# Patient Record
Sex: Female | Born: 1940 | Race: White | Hispanic: Refuse to answer | Marital: Married | Smoking: Never smoker
Health system: Southern US, Community
[De-identification: ages and names within clinical notes are randomized; demographics above are authoritative.]

## PROBLEM LIST (undated history)

## (undated) DIAGNOSIS — I499 Cardiac arrhythmia, unspecified: Secondary | ICD-10-CM

## (undated) DIAGNOSIS — H814 Vertigo of central origin: Secondary | ICD-10-CM

## (undated) DIAGNOSIS — K5792 Diverticulitis of intestine, part unspecified, without perforation or abscess without bleeding: Secondary | ICD-10-CM

## (undated) DIAGNOSIS — E782 Mixed hyperlipidemia: Secondary | ICD-10-CM

## (undated) DIAGNOSIS — M810 Age-related osteoporosis without current pathological fracture: Secondary | ICD-10-CM

## (undated) DIAGNOSIS — K449 Diaphragmatic hernia without obstruction or gangrene: Secondary | ICD-10-CM

## (undated) DIAGNOSIS — I4891 Unspecified atrial fibrillation: Secondary | ICD-10-CM

## (undated) DIAGNOSIS — G250 Essential tremor: Secondary | ICD-10-CM

## (undated) HISTORY — DX: Age-related osteoporosis without current pathological fracture: M81.0

## (undated) HISTORY — DX: Diaphragmatic hernia without obstruction or gangrene: K44.9

## (undated) HISTORY — DX: Vertigo of central origin: H81.4

## (undated) HISTORY — DX: Diverticulitis of intestine, part unspecified, without perforation or abscess without bleeding: K57.92

## (undated) HISTORY — DX: Mixed hyperlipidemia: E78.2

## (undated) HISTORY — DX: Essential tremor: G25.0

## (undated) HISTORY — DX: Unspecified atrial fibrillation: I48.91

---

## 2010-07-24 HISTORY — PX: CATARACT EXTRACTION W/ INTRAOCULAR LENS  IMPLANT, BILATERAL: SHX1307

## 2012-07-24 HISTORY — PX: CARPAL TUNNEL RELEASE: SHX101

## 2015-12-07 DIAGNOSIS — H43813 Vitreous degeneration, bilateral: Secondary | ICD-10-CM | POA: Diagnosis not present

## 2016-01-07 DIAGNOSIS — R509 Fever, unspecified: Secondary | ICD-10-CM | POA: Diagnosis not present

## 2016-01-07 DIAGNOSIS — K5732 Diverticulitis of large intestine without perforation or abscess without bleeding: Secondary | ICD-10-CM | POA: Diagnosis not present

## 2016-01-07 DIAGNOSIS — K529 Noninfective gastroenteritis and colitis, unspecified: Secondary | ICD-10-CM | POA: Diagnosis not present

## 2016-01-07 DIAGNOSIS — R109 Unspecified abdominal pain: Secondary | ICD-10-CM | POA: Diagnosis not present

## 2016-01-07 DIAGNOSIS — R1084 Generalized abdominal pain: Secondary | ICD-10-CM | POA: Diagnosis not present

## 2016-02-01 DIAGNOSIS — R1084 Generalized abdominal pain: Secondary | ICD-10-CM | POA: Diagnosis not present

## 2016-02-01 DIAGNOSIS — K5792 Diverticulitis of intestine, part unspecified, without perforation or abscess without bleeding: Secondary | ICD-10-CM | POA: Diagnosis not present

## 2016-02-01 DIAGNOSIS — K5909 Other constipation: Secondary | ICD-10-CM | POA: Diagnosis not present

## 2016-02-01 DIAGNOSIS — R509 Fever, unspecified: Secondary | ICD-10-CM | POA: Diagnosis not present

## 2016-04-12 DIAGNOSIS — I10 Essential (primary) hypertension: Secondary | ICD-10-CM | POA: Diagnosis not present

## 2016-04-12 DIAGNOSIS — Z23 Encounter for immunization: Secondary | ICD-10-CM | POA: Diagnosis not present

## 2016-04-12 DIAGNOSIS — F419 Anxiety disorder, unspecified: Secondary | ICD-10-CM | POA: Diagnosis not present

## 2016-04-12 DIAGNOSIS — Z1389 Encounter for screening for other disorder: Secondary | ICD-10-CM | POA: Diagnosis not present

## 2016-04-12 DIAGNOSIS — E669 Obesity, unspecified: Secondary | ICD-10-CM | POA: Diagnosis not present

## 2016-04-12 DIAGNOSIS — Z1231 Encounter for screening mammogram for malignant neoplasm of breast: Secondary | ICD-10-CM | POA: Diagnosis not present

## 2016-04-12 DIAGNOSIS — Z0000 Encounter for general adult medical examination without abnormal findings: Secondary | ICD-10-CM | POA: Diagnosis not present

## 2016-04-12 DIAGNOSIS — E7800 Pure hypercholesterolemia, unspecified: Secondary | ICD-10-CM | POA: Diagnosis not present

## 2016-04-12 DIAGNOSIS — Z79899 Other long term (current) drug therapy: Secondary | ICD-10-CM | POA: Diagnosis not present

## 2016-05-05 DIAGNOSIS — Z1231 Encounter for screening mammogram for malignant neoplasm of breast: Secondary | ICD-10-CM | POA: Diagnosis not present

## 2016-05-18 DIAGNOSIS — N644 Mastodynia: Secondary | ICD-10-CM | POA: Diagnosis not present

## 2016-05-18 DIAGNOSIS — N6019 Diffuse cystic mastopathy of unspecified breast: Secondary | ICD-10-CM | POA: Diagnosis not present

## 2016-05-19 DIAGNOSIS — Z9181 History of falling: Secondary | ICD-10-CM | POA: Diagnosis not present

## 2016-05-19 DIAGNOSIS — E669 Obesity, unspecified: Secondary | ICD-10-CM | POA: Diagnosis not present

## 2016-05-19 DIAGNOSIS — Z0000 Encounter for general adult medical examination without abnormal findings: Secondary | ICD-10-CM | POA: Diagnosis not present

## 2016-05-19 DIAGNOSIS — Z136 Encounter for screening for cardiovascular disorders: Secondary | ICD-10-CM | POA: Diagnosis not present

## 2016-05-19 DIAGNOSIS — I10 Essential (primary) hypertension: Secondary | ICD-10-CM | POA: Diagnosis not present

## 2016-05-19 DIAGNOSIS — M810 Age-related osteoporosis without current pathological fracture: Secondary | ICD-10-CM | POA: Diagnosis not present

## 2016-05-24 DIAGNOSIS — Z1211 Encounter for screening for malignant neoplasm of colon: Secondary | ICD-10-CM | POA: Diagnosis not present

## 2016-05-24 DIAGNOSIS — N183 Chronic kidney disease, stage 3 (moderate): Secondary | ICD-10-CM | POA: Diagnosis not present

## 2016-05-24 DIAGNOSIS — F3340 Major depressive disorder, recurrent, in remission, unspecified: Secondary | ICD-10-CM | POA: Diagnosis not present

## 2016-05-24 DIAGNOSIS — Z87898 Personal history of other specified conditions: Secondary | ICD-10-CM | POA: Diagnosis not present

## 2016-05-24 DIAGNOSIS — E785 Hyperlipidemia, unspecified: Secondary | ICD-10-CM | POA: Diagnosis not present

## 2016-05-24 DIAGNOSIS — K219 Gastro-esophageal reflux disease without esophagitis: Secondary | ICD-10-CM | POA: Diagnosis not present

## 2016-05-24 DIAGNOSIS — I129 Hypertensive chronic kidney disease with stage 1 through stage 4 chronic kidney disease, or unspecified chronic kidney disease: Secondary | ICD-10-CM | POA: Diagnosis not present

## 2016-05-24 DIAGNOSIS — M810 Age-related osteoporosis without current pathological fracture: Secondary | ICD-10-CM | POA: Diagnosis not present

## 2016-07-25 DIAGNOSIS — K219 Gastro-esophageal reflux disease without esophagitis: Secondary | ICD-10-CM | POA: Diagnosis not present

## 2016-07-25 DIAGNOSIS — R195 Other fecal abnormalities: Secondary | ICD-10-CM | POA: Diagnosis not present

## 2016-08-31 DIAGNOSIS — Z9049 Acquired absence of other specified parts of digestive tract: Secondary | ICD-10-CM | POA: Diagnosis not present

## 2016-08-31 DIAGNOSIS — N189 Chronic kidney disease, unspecified: Secondary | ICD-10-CM | POA: Diagnosis not present

## 2016-08-31 DIAGNOSIS — E785 Hyperlipidemia, unspecified: Secondary | ICD-10-CM | POA: Diagnosis not present

## 2016-08-31 DIAGNOSIS — K219 Gastro-esophageal reflux disease without esophagitis: Secondary | ICD-10-CM | POA: Diagnosis not present

## 2016-08-31 DIAGNOSIS — K449 Diaphragmatic hernia without obstruction or gangrene: Secondary | ICD-10-CM | POA: Diagnosis not present

## 2016-08-31 DIAGNOSIS — K2970 Gastritis, unspecified, without bleeding: Secondary | ICD-10-CM | POA: Diagnosis not present

## 2016-08-31 DIAGNOSIS — K3189 Other diseases of stomach and duodenum: Secondary | ICD-10-CM | POA: Diagnosis not present

## 2016-08-31 DIAGNOSIS — B9681 Helicobacter pylori [H. pylori] as the cause of diseases classified elsewhere: Secondary | ICD-10-CM | POA: Diagnosis not present

## 2016-08-31 DIAGNOSIS — K5790 Diverticulosis of intestine, part unspecified, without perforation or abscess without bleeding: Secondary | ICD-10-CM | POA: Diagnosis not present

## 2016-08-31 DIAGNOSIS — K2900 Acute gastritis without bleeding: Secondary | ICD-10-CM | POA: Diagnosis not present

## 2016-08-31 DIAGNOSIS — R195 Other fecal abnormalities: Secondary | ICD-10-CM | POA: Diagnosis not present

## 2016-09-08 DIAGNOSIS — I480 Paroxysmal atrial fibrillation: Secondary | ICD-10-CM | POA: Diagnosis not present

## 2016-09-08 DIAGNOSIS — R0602 Shortness of breath: Secondary | ICD-10-CM | POA: Diagnosis not present

## 2016-09-08 DIAGNOSIS — I499 Cardiac arrhythmia, unspecified: Secondary | ICD-10-CM | POA: Diagnosis not present

## 2016-09-08 DIAGNOSIS — R42 Dizziness and giddiness: Secondary | ICD-10-CM | POA: Diagnosis not present

## 2016-10-16 DIAGNOSIS — N182 Chronic kidney disease, stage 2 (mild): Secondary | ICD-10-CM | POA: Diagnosis not present

## 2016-10-16 DIAGNOSIS — R7303 Prediabetes: Secondary | ICD-10-CM | POA: Diagnosis not present

## 2016-10-16 DIAGNOSIS — Z87898 Personal history of other specified conditions: Secondary | ICD-10-CM | POA: Diagnosis not present

## 2016-10-16 DIAGNOSIS — F3340 Major depressive disorder, recurrent, in remission, unspecified: Secondary | ICD-10-CM | POA: Diagnosis not present

## 2016-10-16 DIAGNOSIS — Z79899 Other long term (current) drug therapy: Secondary | ICD-10-CM | POA: Diagnosis not present

## 2016-10-16 DIAGNOSIS — K219 Gastro-esophageal reflux disease without esophagitis: Secondary | ICD-10-CM | POA: Diagnosis not present

## 2016-10-16 DIAGNOSIS — I129 Hypertensive chronic kidney disease with stage 1 through stage 4 chronic kidney disease, or unspecified chronic kidney disease: Secondary | ICD-10-CM | POA: Diagnosis not present

## 2016-10-16 DIAGNOSIS — M810 Age-related osteoporosis without current pathological fracture: Secondary | ICD-10-CM | POA: Diagnosis not present

## 2016-10-16 DIAGNOSIS — E785 Hyperlipidemia, unspecified: Secondary | ICD-10-CM | POA: Diagnosis not present

## 2017-01-03 DIAGNOSIS — H6121 Impacted cerumen, right ear: Secondary | ICD-10-CM | POA: Diagnosis not present

## 2017-01-03 DIAGNOSIS — L03211 Cellulitis of face: Secondary | ICD-10-CM | POA: Diagnosis not present

## 2017-04-02 DIAGNOSIS — R109 Unspecified abdominal pain: Secondary | ICD-10-CM | POA: Diagnosis not present

## 2017-04-02 DIAGNOSIS — R300 Dysuria: Secondary | ICD-10-CM | POA: Diagnosis not present

## 2017-04-02 DIAGNOSIS — K6289 Other specified diseases of anus and rectum: Secondary | ICD-10-CM | POA: Diagnosis not present

## 2017-04-13 DIAGNOSIS — K5732 Diverticulitis of large intestine without perforation or abscess without bleeding: Secondary | ICD-10-CM | POA: Diagnosis not present

## 2017-04-16 DIAGNOSIS — E7800 Pure hypercholesterolemia, unspecified: Secondary | ICD-10-CM | POA: Diagnosis not present

## 2017-04-16 DIAGNOSIS — Z79899 Other long term (current) drug therapy: Secondary | ICD-10-CM | POA: Diagnosis not present

## 2017-04-16 DIAGNOSIS — I129 Hypertensive chronic kidney disease with stage 1 through stage 4 chronic kidney disease, or unspecified chronic kidney disease: Secondary | ICD-10-CM | POA: Diagnosis not present

## 2017-04-16 DIAGNOSIS — Z0000 Encounter for general adult medical examination without abnormal findings: Secondary | ICD-10-CM | POA: Diagnosis not present

## 2017-04-16 DIAGNOSIS — Z1389 Encounter for screening for other disorder: Secondary | ICD-10-CM | POA: Diagnosis not present

## 2017-04-16 DIAGNOSIS — E559 Vitamin D deficiency, unspecified: Secondary | ICD-10-CM | POA: Diagnosis not present

## 2017-05-07 DIAGNOSIS — Z1231 Encounter for screening mammogram for malignant neoplasm of breast: Secondary | ICD-10-CM | POA: Diagnosis not present

## 2017-05-22 DIAGNOSIS — Z136 Encounter for screening for cardiovascular disorders: Secondary | ICD-10-CM | POA: Diagnosis not present

## 2017-05-22 DIAGNOSIS — Z0000 Encounter for general adult medical examination without abnormal findings: Secondary | ICD-10-CM | POA: Diagnosis not present

## 2017-05-22 DIAGNOSIS — E785 Hyperlipidemia, unspecified: Secondary | ICD-10-CM | POA: Diagnosis not present

## 2017-05-22 DIAGNOSIS — Z23 Encounter for immunization: Secondary | ICD-10-CM | POA: Diagnosis not present

## 2017-05-22 DIAGNOSIS — Z1389 Encounter for screening for other disorder: Secondary | ICD-10-CM | POA: Diagnosis not present

## 2017-10-17 DIAGNOSIS — E782 Mixed hyperlipidemia: Secondary | ICD-10-CM | POA: Insufficient documentation

## 2017-10-17 DIAGNOSIS — M810 Age-related osteoporosis without current pathological fracture: Secondary | ICD-10-CM | POA: Insufficient documentation

## 2017-10-17 DIAGNOSIS — F3340 Major depressive disorder, recurrent, in remission, unspecified: Secondary | ICD-10-CM | POA: Insufficient documentation

## 2017-10-17 DIAGNOSIS — K5900 Constipation, unspecified: Secondary | ICD-10-CM | POA: Insufficient documentation

## 2017-10-17 DIAGNOSIS — G4700 Insomnia, unspecified: Secondary | ICD-10-CM | POA: Insufficient documentation

## 2017-10-26 DIAGNOSIS — G250 Essential tremor: Secondary | ICD-10-CM | POA: Insufficient documentation

## 2018-04-04 DIAGNOSIS — G4452 New daily persistent headache (NDPH): Secondary | ICD-10-CM | POA: Insufficient documentation

## 2018-04-04 DIAGNOSIS — H814 Vertigo of central origin: Secondary | ICD-10-CM | POA: Insufficient documentation

## 2018-07-31 DIAGNOSIS — L989 Disorder of the skin and subcutaneous tissue, unspecified: Secondary | ICD-10-CM | POA: Insufficient documentation

## 2019-03-11 DIAGNOSIS — Z79899 Other long term (current) drug therapy: Secondary | ICD-10-CM | POA: Insufficient documentation

## 2019-09-16 DIAGNOSIS — R7309 Other abnormal glucose: Secondary | ICD-10-CM | POA: Insufficient documentation

## 2019-11-26 DIAGNOSIS — R6884 Jaw pain: Secondary | ICD-10-CM | POA: Insufficient documentation

## 2020-01-11 DIAGNOSIS — I4891 Unspecified atrial fibrillation: Secondary | ICD-10-CM

## 2020-01-11 DIAGNOSIS — K219 Gastro-esophageal reflux disease without esophagitis: Secondary | ICD-10-CM | POA: Diagnosis not present

## 2020-01-15 DIAGNOSIS — I4819 Other persistent atrial fibrillation: Secondary | ICD-10-CM | POA: Insufficient documentation

## 2020-02-02 ENCOUNTER — Other Ambulatory Visit: Payer: Self-pay

## 2020-02-02 ENCOUNTER — Encounter: Payer: Self-pay | Admitting: Cardiology

## 2020-02-02 ENCOUNTER — Ambulatory Visit: Payer: Medicare HMO | Admitting: Cardiology

## 2020-02-02 VITALS — BP 124/78 | HR 88 | Ht 62.0 in | Wt 135.0 lb

## 2020-02-02 DIAGNOSIS — I480 Paroxysmal atrial fibrillation: Secondary | ICD-10-CM | POA: Diagnosis not present

## 2020-02-02 DIAGNOSIS — I10 Essential (primary) hypertension: Secondary | ICD-10-CM | POA: Diagnosis not present

## 2020-02-02 DIAGNOSIS — K449 Diaphragmatic hernia without obstruction or gangrene: Secondary | ICD-10-CM | POA: Insufficient documentation

## 2020-02-02 DIAGNOSIS — E782 Mixed hyperlipidemia: Secondary | ICD-10-CM | POA: Diagnosis not present

## 2020-02-02 NOTE — Patient Instructions (Addendum)
Medication Instructions:  Your physician has recommended you make the following change in your medication:   Take Xarelto 20 mg daily.  *If you need a refill on your cardiac medications before your next appointment, please call your pharmacy*   Lab Work: None ordered If you have labs (blood work) drawn today and your tests are completely normal, you will receive your results only by: Marland Kitchen MyChart Message (if you have MyChart) OR . A paper copy in the mail If you have any lab test that is abnormal or we need to change your treatment, we will call you to review the results.   Testing/Procedures: Your physician has requested that you have an echocardiogram. Echocardiography is a painless test that uses sound waves to create images of your heart. It provides your doctor with information about the size and shape of your heart and how well your heart's chambers and valves are working. This procedure takes approximately one hour. There are no restrictions for this procedure.     Follow-Up: At [facility], you and your health needs are our priority.  As part of our continuing mission to provide you with exceptional heart care, we have created designated Provider Care Teams.  These Care Teams include your primary Cardiologist (physician) and Advanced Practice Providers (APPs -  Physician Assistants and Nurse Practitioners) who all work together to provide you with the care you need, when you need it.  We recommend signing up for the patient portal called "MyChart".  Sign up information is provided on this After Visit Summary.  MyChart is used to connect with patients for Virtual Visits (Telemedicine).  Patients are able to view lab/test results, encounter notes, upcoming appointments, etc.  Non-urgent messages can be sent to your provider as well.   To learn more about what you can do with MyChart, go to [email or web address].    Your next appointment:   1 month(s)  The format for your next  appointment:   In Person  Provider:   Thomasene Ripple, DO   Other Instructions NA

## 2020-02-02 NOTE — Progress Notes (Signed)
Cardiology Office Note:    Date:  02/02/2020   ID:  Christy Gomez, DOB 07/12/1941, MRN [id number]  PCP:  Laurel Dimmer, FNP  Cardiologist:  Thomasene Ripple, DO  Electrophysiologist:  None   Referring MD: Selena Batten *   " I am doing fine"  History of Present Illness:    Christy Gomez is a 79 y.o. female with a hx of recently diagnosed atrial fibrillation, started on Xarelto and is currently on metoprolol, essential tremors, hyperlipidemia presents today to be evaluated for A. Fib.  The patient tells me that she does not feel any shortness of breath, chest pain however was told by her PCP that she was noted to be atrial fibrillation.  Past Medical History:  Diagnosis Date  . Atrial fibrillation (HCC)   . Benign familial tremor   . Cerebellar vertigo, unspecified laterality   . Disabling essential tremor   . Diverticulitis   . Hiatal hernia   . Mixed hyperlipidemia   . Osteoporosis     Past Surgical History:  Procedure Laterality Date  . CARPAL TUNNEL RELEASE Right 2014  . CATARACT EXTRACTION W/ INTRAOCULAR LENS  IMPLANT, BILATERAL  2012    Current Medications: Current Meds  Medication Sig  . acetaminophen (TYLENOL) 325 MG tablet Take 1 tablet by mouth daily.  Marland Kitchen aspirin 81 MG EC tablet Take 1 tablet by mouth daily.  . Cholecalciferol 50 MCG (2000 UT) CAPS Take 1 capsule by mouth daily.  . clonazePAM (KLONOPIN) 1 MG tablet Take by mouth. Take 0.5 - 1 tablet by mouth 2 times daily as needed for anxiety (tremor)  . esomeprazole (NEXIUM) 20 MG capsule Take 1 capsule by mouth daily.  . fluticasone (FLONASE) 50 MCG/ACT nasal spray Place 1 spray into both nostrils as needed.  . meclizine (ANTIVERT) 12.5 MG tablet Take 1 tablet by mouth daily.  . metoprolol tartrate (LOPRESSOR) 25 MG tablet Take 1 tablet by mouth daily.  . ondansetron (ZOFRAN-ODT) 4 MG disintegrating tablet Take 1 tablet by mouth as needed.      Allergies:   Diazepam, Levofloxacin,  Propranolol, Ciprofloxacin, Gabapentin, Metronidazole, Other, Oxycodone-acetaminophen, Primidone, Sertraline, Topiramate, and Colesevelam   Social History   Socioeconomic History  . Marital status: Married    Spouse name: Not on file  . Number of children: Not on file  . Years of education: Not on file  . Highest education level: Not on file  Occupational History  . Not on file  Tobacco Use  . Smoking status: Never Smoker  . Smokeless tobacco: Never Used  Substance and Sexual Activity  . Alcohol use: Never  . Drug use: Never  . Sexual activity: Not on file  Other Topics Concern  . Not on file  Social History Narrative  . Not on file   Social Determinants of Health   Financial Resource Strain:   . Difficulty of Paying Living Expenses:   Food Insecurity:   . Worried About Programme researcher, broadcasting/film/video in the Last Year:   . Barista in the Last Year:   Transportation Needs:   . Freight forwarder (Medical):   Marland Kitchen Lack of Transportation (Non-Medical):   Physical Activity:   . Days of Exercise per Week:   . Minutes of Exercise per Session:   Stress:   . Feeling of Stress :   Social Connections:   . Frequency of Communication with Friends and Family:   . Frequency of Social Gatherings with Friends and Family:   .  Attends Religious Services:   . Active Member of Clubs or Organizations:   . Attends BankerClub or Organization Meetings:   Marland Kitchen. Marital Status:      Family History: The patient's family history includes Colon cancer in her mother; Tremor in her maternal grandmother, mother, and another family member.  ROS:   Review of Systems  Constitution: Negative for decreased appetite, fever and weight gain.  HENT: Negative for congestion, ear discharge, hoarse voice and sore throat.   Eyes: Negative for discharge, redness, vision loss in right eye and visual halos.  Cardiovascular: Negative for chest pain, dyspnea on exertion, leg swelling, orthopnea and palpitations.   Respiratory: Negative for cough, hemoptysis, shortness of breath and snoring.   Endocrine: Negative for heat intolerance and polyphagia.  Hematologic/Lymphatic: Negative for bleeding problem. Does not bruise/bleed easily.  Skin: Negative for flushing, nail changes, rash and suspicious lesions.  Musculoskeletal: Negative for arthritis, joint pain, muscle cramps, myalgias, neck pain and stiffness.  Gastrointestinal: Negative for abdominal pain, bowel incontinence, diarrhea and excessive appetite.  Genitourinary: Negative for decreased libido, genital sores and incomplete emptying.  Neurological: Negative for brief paralysis, focal weakness, headaches and loss of balance.  Psychiatric/Behavioral: Negative for altered mental status, depression and suicidal ideas.  Allergic/Immunologic: Negative for HIV exposure and persistent infections.    EKGs/Labs/Other Studies Reviewed:    The following studies were reviewed today:   EKG:  The ekg ordered today demonstrates atrial fibrillation, with controlled ventricular rate heart rate 92 bpm, compared to EKG done at her PCP office no significant change  Recent Labs: No results found for requested labs within last 8760 hours.  Recent Lipid Panel No results found for: CHOL, TRIG, HDL, CHOLHDL, VLDL, LDLCALC, LDLDIRECT  Physical Exam:    VS:  BP 124/78 (BP Location: Right Arm, Patient Position: Sitting, Cuff Size: Normal)   Pulse 88   Ht 5\' 2"  (1.575 m)   Wt 135 lb (61.2 kg)   SpO2 99%   BMI 24.69 kg/m     Wt Readings from Last 3 Encounters:  02/02/20 135 lb (61.2 kg)     GEN: Well nourished, well developed in no acute distress HEENT: Normal NECK: No JVD; No carotid bruits LYMPHATICS: No lymphadenopathy CARDIAC: S1S2 noted,RRR, no murmurs, rubs, gallops RESPIRATORY:  Clear to auscultation without rales, wheezing or rhonchi  ABDOMEN: Soft, non-tender, non-distended, +bowel sounds, no guarding. EXTREMITIES: No edema, No cyanosis, no  clubbing MUSCULOSKELETAL:  No deformity  SKIN: Warm and dry NEUROLOGIC:  Alert and oriented x 3, non-focal PSYCHIATRIC:  Normal affect, good insight  ASSESSMENT:    1. Paroxysmal atrial fibrillation (HCC)   2. Essential hypertension   3. Mixed hyperlipidemia    PLAN:    Atrial fibrillation-patient has been started on rate control agents.  She was placed on Xarelto 20 mg daily.  I did educate the patient was important for her to continue taking her Xarelto will be given the patient samples in the office today.  Her risk of stroke is increased with a chads vas score of 3 so far.  I will also get a echocardiogram to assess her LV function. I have educated patient on the side effects of this medication. I also urge her to abstain from any taking behaviors.  She understands that she is now at a high risk of bleeding due to being on anticoagulation.  She was also advised that if she ever falls and especially hit her head to be seen in the emergency department.  Looking  into the future if the patient does have essential tremor.  I do believe she will be a candidate for watchman device but for now she does not want to have any procedure for him.  We will rediscuss this and if she is agreeable I will refer her for evaluation for watchman.  Work-up plan took fully anticoagulate the patient.  I will see her back in 1 month and if she is still in fibrillation will consider possible rhythm control strategy as well.  Hypertension blood pressure deceptively in the office no changes will be made.  Hyperlipidemia continue patient her current regimen.  She has significant intolerance to statins as well as WelChol  The patient is in agreement with the above plan. The patient left the office in stable condition.  The patient will follow up in 1 month or sooner if needed.  Medication Adjustments/Labs and Tests Ordered: Current medicines are reviewed at length with the patient today.  Concerns regarding  medicines are outlined above.  No orders of the defined types were placed in this encounter.  No orders of the defined types were placed in this encounter.   Patient Instructions  Medication Instructions:  No medication changes. *If you need a refill on your cardiac medications before your next appointment, please call your pharmacy*   Lab Work: None ordered If you have labs (blood work) drawn today and your tests are completely normal, you will receive your results only by: Marland Kitchen MyChart Message (if you have MyChart) OR . A paper copy in the mail If you have any lab test that is abnormal or we need to change your treatment, we will call you to review the results.   Testing/Procedures: Your physician has requested that you have an echocardiogram. Echocardiography is a painless test that uses sound waves to create images of your heart. It provides your doctor with information about the size and shape of your heart and how well your heart's chambers and valves are working. This procedure takes approximately one hour. There are no restrictions for this procedure.     Follow-Up: At [facility], you and your health needs are our priority.  As part of our continuing mission to provide you with exceptional heart care, we have created designated Provider Care Teams.  These Care Teams include your primary Cardiologist (physician) and Advanced Practice Providers (APPs -  Physician Assistants and Nurse Practitioners) who all work together to provide you with the care you need, when you need it.  We recommend signing up for the patient portal called "MyChart".  Sign up information is provided on this After Visit Summary.  MyChart is used to connect with patients for Virtual Visits (Telemedicine).  Patients are able to view lab/test results, encounter notes, upcoming appointments, etc.  Non-urgent messages can be sent to your provider as well.   To learn more about what you can do with MyChart, go to  [email or web address].    Your next appointment:   1 month(s)  The format for your next appointment:   In Person  Provider:   Thomasene Ripple, DO   Other Instructions NA     Adopting a Healthy Lifestyle.  Know what a healthy weight is for you (roughly BMI <25) and aim to maintain this   Aim for 7+ servings of fruits and vegetables daily   65-80+ fluid ounces of water or unsweet tea for healthy kidneys   Limit to max 1 drink of alcohol per day; avoid smoking/tobacco   Limit animal  fats in diet for cholesterol and heart health - choose grass fed whenever available   Avoid highly processed foods, and foods high in saturated/trans fats   Aim for low stress - take time to unwind and care for your mental health   Aim for 150 min of moderate intensity exercise weekly for heart health, and weights twice weekly for bone health   Aim for 7-9 hours of sleep daily   When it comes to diets, agreement about the perfect plan isnt easy to find, even among the experts. Experts at the [facility] developed an idea known as the Healthy Eating Plate. Just imagine a plate divided into logical, healthy portions.   The emphasis is on diet quality:   Load up on vegetables and fruits - one-half of your plate: Aim for color and variety, and remember that potatoes dont count.   Go for whole grains - one-quarter of your plate: Whole wheat, barley, wheat berries, quinoa, oats, brown rice, and foods made with them. If you want pasta, go with whole wheat pasta.   Protein power - one-quarter of your plate: Fish, chicken, beans, and nuts are all healthy, versatile protein sources. Limit red meat.   The diet, however, does go beyond the plate, offering a few other suggestions.   Use healthy plant oils, such as olive, canola, soy, corn, sunflower and peanut. Check the labels, and avoid partially hydrogenated oil, which have unhealthy trans fats.   If youre thirsty, drink water.  Coffee and tea are good in moderation, but skip sugary drinks and limit milk and dairy products to one or two daily servings.   The type of carbohydrate in the diet is more important than the amount. Some sources of carbohydrates, such as vegetables, fruits, whole grains, and beans-are healthier than others.   Finally, stay active  Signed, Thomasene Ripple, DO  02/02/2020 11:10 AM    [facility]

## 2020-02-06 MED ORDER — RIVAROXABAN 20 MG PO TABS: 20.0000 mg | ORAL_TABLET | Freq: Every day | ORAL | 12 refills | Status: AC

## 2020-02-06 NOTE — Addendum Note (Signed)
Addended by: Eleonore Chiquito on: 02/06/2020 02:59 PM   Modules accepted: Orders

## 2020-02-13 ENCOUNTER — Ambulatory Visit (INDEPENDENT_AMBULATORY_CARE_PROVIDER_SITE_OTHER): Payer: Medicare HMO

## 2020-02-13 ENCOUNTER — Other Ambulatory Visit: Payer: Self-pay

## 2020-02-13 DIAGNOSIS — I480 Paroxysmal atrial fibrillation: Secondary | ICD-10-CM

## 2020-02-13 LAB — ECHOCARDIOGRAM COMPLETE
Area-P 1/2: 3.6 cm2
S' Lateral: 2.7 cm

## 2020-02-13 NOTE — Progress Notes (Signed)
Complete echocardiogram performed.  Jimmy Karey Stucki RDCS, RVT  

## 2020-02-16 ENCOUNTER — Ambulatory Visit: Payer: Self-pay | Admitting: Cardiology

## 2020-02-17 ENCOUNTER — Telehealth: Payer: Self-pay

## 2020-02-17 NOTE — Telephone Encounter (Signed)
Spoke with patient regarding results and recommendation.  Patient verbalizes understanding and is agreeable to plan of care. Advised patient to call back with any issues or concerns.  

## 2020-02-17 NOTE — Telephone Encounter (Signed)
-----   Message from Thomasene Ripple, DO sent at 02/17/2020 10:35 AM EDT -----  You have some tricuspid regurgitation, but otherwise the rest of the echo is normal

## 2020-03-11 ENCOUNTER — Other Ambulatory Visit: Payer: Self-pay

## 2020-03-11 ENCOUNTER — Encounter: Payer: Self-pay | Admitting: Cardiology

## 2020-03-11 ENCOUNTER — Ambulatory Visit: Payer: Medicare HMO | Admitting: Cardiology

## 2020-03-11 VITALS — BP 120/70 | HR 101 | Ht 62.0 in | Wt 125.0 lb

## 2020-03-11 DIAGNOSIS — E782 Mixed hyperlipidemia: Secondary | ICD-10-CM | POA: Diagnosis not present

## 2020-03-11 DIAGNOSIS — I480 Paroxysmal atrial fibrillation: Secondary | ICD-10-CM

## 2020-03-11 NOTE — Patient Instructions (Signed)
Medication Instructions:  No medication changes. *If you need a refill on your cardiac medications before your next appointment, please call your pharmacy*   Lab Work: None ordered If you have labs (blood work) drawn today and your tests are completely normal, you will receive your results only by: . MyChart Message (if you have MyChart) OR . A paper copy in the mail If you have any lab test that is abnormal or we need to change your treatment, we will call you to review the results.   Testing/Procedures: None ordered   Follow-Up: At [facility], you and your health needs are our priority.  As part of our continuing mission to provide you with exceptional heart care, we have created designated Provider Care Teams.  These Care Teams include your primary Cardiologist (physician) and Advanced Practice Providers (APPs -  Physician Assistants and Nurse Practitioners) who all work together to provide you with the care you need, when you need it.  We recommend signing up for the patient portal called "MyChart".  Sign up information is provided on this After Visit Summary.  MyChart is used to connect with patients for Virtual Visits (Telemedicine).  Patients are able to view lab/test results, encounter notes, upcoming appointments, etc.  Non-urgent messages can be sent to your provider as well.   To learn more about what you can do with MyChart, go to [email or web address].    Your next appointment:   3 month(s)  The format for your next appointment:   In Person  Provider:   Kardie Tobb, DO   Other Instructions NA 

## 2020-03-11 NOTE — Progress Notes (Signed)
Cardiology Office Note:    Date:  03/11/2020   ID:  Christy Gomez, DOB 03/05/1941, MRN [id number]  PCP:  Laurel DimmerKruppenbach, Katherine H, FNP  Cardiologist:  Thomasene RippleKardie Kerolos Nehme, DO  Electrophysiologist:  None   Referring MD: Selena BattenKruppenbach, Katherine *   Chief Complaint  Patient presents with  . Follow-up  I am going to be having a colonoscopy/endoscopy  History of Present Illness:    Christy Gomez is a 79 y.o. female with a hx of paroxysmal atrial fibrillation on Xarelto, essential tremors, hyperlipidemia presents today for follow-up visit.  I did see the patient on 02/02/2020 at that time she was on Xarelto and had recently been diagnosed with atrial fibrillation.  I continued patient on Xarelto, additionally I recommended consideration for watchman given her history of essential tremors but the patient did not want to discuss this further at that time.  An echocardiogram was ordered in the meantime which she is results has previously been called out to the people she did have mild to moderate tricuspid regurgitation as well as trivial mitral vegetation.  Her ejection fraction was normal.   Past Medical History:  Diagnosis Date  . Atrial fibrillation (HCC)   . Benign familial tremor   . Cerebellar vertigo, unspecified laterality   . Disabling essential tremor   . Diverticulitis   . Hiatal hernia   . Mixed hyperlipidemia   . Osteoporosis     Past Surgical History:  Procedure Laterality Date  . CARPAL TUNNEL RELEASE Right 2014  . CATARACT EXTRACTION W/ INTRAOCULAR LENS  IMPLANT, BILATERAL  2012    Current Medications: Current Meds  Medication Sig  . acetaminophen (TYLENOL) 325 MG tablet Take 1 tablet by mouth daily.  Marland Kitchen. aspirin 81 MG EC tablet Take 1 tablet by mouth daily.  . Cholecalciferol 50 MCG (2000 UT) CAPS Take 1 capsule by mouth daily.  . clonazePAM (KLONOPIN) 1 MG tablet Take by mouth. Take 0.5 - 1 tablet by mouth 2 times daily as needed for anxiety (tremor)  . esomeprazole  (NEXIUM) 20 MG capsule Take 1 capsule by mouth daily.  . fluticasone (FLONASE) 50 MCG/ACT nasal spray Place 1 spray into both nostrils as needed.  . meclizine (ANTIVERT) 12.5 MG tablet Take 1 tablet by mouth daily.  . metoprolol tartrate (LOPRESSOR) 25 MG tablet Take 1 tablet by mouth daily.  . ondansetron (ZOFRAN-ODT) 4 MG disintegrating tablet Take 1 tablet by mouth as needed.   . predniSONE (DELTASONE) 50 MG tablet Take 1 tablet by mouth daily.   . rivaroxaban (XARELTO) 20 MG TABS tablet Take 1 tablet (20 mg total) by mouth daily with supper.     Allergies:   Diazepam, Levofloxacin, Propranolol, Ciprofloxacin, Gabapentin, Metronidazole, Other, Oxycodone-acetaminophen, Primidone, Sertraline, Topiramate, Acetaminophen, Hydrocodone, and Colesevelam   Social History   Socioeconomic History  . Marital status: Married    Spouse name: Not on file  . Number of children: Not on file  . Years of education: Not on file  . Highest education level: Not on file  Occupational History  . Not on file  Tobacco Use  . Smoking status: Never Smoker  . Smokeless tobacco: Never Used  Substance and Sexual Activity  . Alcohol use: Never  . Drug use: Never  . Sexual activity: Not on file  Other Topics Concern  . Not on file  Social History Narrative  . Not on file   Social Determinants of Health   Financial Resource Strain:   . Difficulty of Paying Living  Expenses: Not on file  Food Insecurity:   . Worried About Programme researcher, broadcasting/film/video in the Last Year: Not on file  . Ran Out of Food in the Last Year: Not on file  Transportation Needs:   . Lack of Transportation (Medical): Not on file  . Lack of Transportation (Non-Medical): Not on file  Physical Activity:   . Days of Exercise per Week: Not on file  . Minutes of Exercise per Session: Not on file  Stress:   . Feeling of Stress : Not on file  Social Connections:   . Frequency of Communication with Friends and Family: Not on file  . Frequency of  Social Gatherings with Friends and Family: Not on file  . Attends Religious Services: Not on file  . Active Member of Clubs or Organizations: Not on file  . Attends Banker Meetings: Not on file  . Marital Status: Not on file     Family History: The patient's family history includes Colon cancer in her mother; Tremor in her maternal grandmother, mother, and another family member.  ROS:   Review of Systems  Constitution: Negative for decreased appetite, fever and weight gain.  HENT: Negative for congestion, ear discharge, hoarse voice and sore throat.   Eyes: Negative for discharge, redness, vision loss in right eye and visual halos.  Cardiovascular: Negative for chest pain, dyspnea on exertion, leg swelling, orthopnea and palpitations.  Respiratory: Negative for cough, hemoptysis, shortness of breath and snoring.   Endocrine: Negative for heat intolerance and polyphagia.  Hematologic/Lymphatic: Negative for bleeding problem. Does not bruise/bleed easily.  Skin: Negative for flushing, nail changes, rash and suspicious lesions.  Musculoskeletal: Negative for arthritis, joint pain, muscle cramps, myalgias, neck pain and stiffness.  Gastrointestinal: Negative for abdominal pain, bowel incontinence, diarrhea and excessive appetite.  Genitourinary: Negative for decreased libido, genital sores and incomplete emptying.  Neurological: Negative for brief paralysis, focal weakness, headaches and loss of balance.  Psychiatric/Behavioral: Negative for altered mental status, depression and suicidal ideas.  Allergic/Immunologic: Negative for HIV exposure and persistent infections.    EKGs/Labs/Other Studies Reviewed:    The following studies were reviewed today:   EKG: None today.   Echo IMPRESSIONS 02/13/2020 1. Left ventricular ejection fraction, by estimation, is 60 to 65%. The left ventricle has normal function. The left ventricle has no regional wall motion abnormalities. Left  ventricular diastolic parameters are  indeterminate.  2. Right ventricular systolic function is normal. The right ventricular size is normal. There is normal pulmonary artery systolic pressure.  3. The mitral valve is normal in structure. Trivial mitral valve regurgitation. No evidence of mitral stenosis.  4. Tricuspid valve regurgitation is mild to moderate.  5. The aortic valve is normal in structure. Aortic valve regurgitation is trivial. No aortic stenosis is present.  6. The inferior vena cava is normal in size with greater than 50% respiratory variability, suggesting right atrial pressure of 3 mmHg  Recent Labs: No results found for requested labs within last 8760 hours.  Recent Lipid Panel No results found for: CHOL, TRIG, HDL, CHOLHDL, VLDL, LDLCALC, LDLDIRECT  Physical Exam:    VS:  BP 120/70 (BP Location: Left Arm, Patient Position: Sitting, Cuff Size: Normal)   Pulse (!) 101   Ht 5\' 2"  (1.575 m)   Wt 125 lb (56.7 kg)   SpO2 91%   BMI 22.86 kg/m     Wt Readings from Last 3 Encounters:  03/11/20 125 lb (56.7 kg)  02/02/20 135 lb (  61.2 kg)     GEN: Well nourished, well developed in no acute distress HEENT: Normal NECK: No JVD; No carotid bruits LYMPHATICS: No lymphadenopathy CARDIAC: S1S2 noted,RRR, no murmurs, rubs, gallops RESPIRATORY:  Clear to auscultation without rales, wheezing or rhonchi  ABDOMEN: Soft, non-tender, non-distended, +bowel sounds, no guarding. EXTREMITIES: No edema, No cyanosis, no clubbing MUSCULOSKELETAL:  No deformity  SKIN: Warm and dry NEUROLOGIC:  Alert and oriented x 3, non-focal PSYCHIATRIC:  Normal affect, good insight  ASSESSMENT:    No diagnosis found. PLAN:     She is planning on getting EGD/colonoscopy due to significant weight loss.  At this time there are question about how long she can she hold her anticoagulation.  I discussed with the patient the risk and benefit of holding her anticoagulation.  From a cardiovascular  standpoint she can proceed with her EGD/colonoscopy.  She may hold her Xarelto for 3 doses which will be 3 days prior to her procedure.  She can resume her Xarelto the next day post procedure at the discretion of her gastroenterologist performing her EGD/colonoscopy.  For now she will hold off on discussing the watchman possibility.  Hoping that after her procedure were able to discuss this and refer her to EP.  Hyperlipidemia she prefers diet modification.  The patient is in agreement with the above plan. The patient left the office in stable condition.  The patient will follow up in 3 months or sooner as needed.  Medication Adjustments/Labs and Tests Ordered: Current medicines are reviewed at length with the patient today.  Concerns regarding medicines are outlined above.  No orders of the defined types were placed in this encounter.  No orders of the defined types were placed in this encounter.   There are no Patient Instructions on file for this visit.   Adopting a Healthy Lifestyle.  Know what a healthy weight is for you (roughly BMI <25) and aim to maintain this   Aim for 7+ servings of fruits and vegetables daily   65-80+ fluid ounces of water or unsweet tea for healthy kidneys   Limit to max 1 drink of alcohol per day; avoid smoking/tobacco   Limit animal fats in diet for cholesterol and heart health - choose grass fed whenever available   Avoid highly processed foods, and foods high in saturated/trans fats   Aim for low stress - take time to unwind and care for your mental health   Aim for 150 min of moderate intensity exercise weekly for heart health, and weights twice weekly for bone health   Aim for 7-9 hours of sleep daily   When it comes to diets, agreement about the perfect plan isnt easy to find, even among the experts. Experts at the [facility] [employer] developed an idea known as the Healthy Eating Plate. Just imagine a plate divided into logical,  healthy portions.   The emphasis is on diet quality:   Load up on vegetables and fruits - one-half of your plate: Aim for color and variety, and remember that potatoes dont count.   Go for whole grains - one-quarter of your plate: Whole wheat, barley, wheat berries, quinoa, oats, brown rice, and foods made with them. If you want pasta, go with whole wheat pasta.   Protein power - one-quarter of your plate: Fish, chicken, beans, and nuts are all healthy, versatile protein sources. Limit red meat.   The diet, however, does go beyond the plate, offering a few other suggestions.  Use healthy plant oils, such as olive, canola, soy, corn, sunflower and peanut. Check the labels, and avoid partially hydrogenated oil, which have unhealthy trans fats.   If youre thirsty, drink water. Coffee and tea are good in moderation, but skip sugary drinks and limit milk and dairy products to one or two daily servings.   The type of carbohydrate in the diet is more important than the amount. Some sources of carbohydrates, such as vegetables, fruits, whole grains, and beans-are healthier than others.   Finally, stay active  Signed, Thomasene Ripple, DO  03/11/2020 9:18 AM    [facility]

## 2020-03-18 ENCOUNTER — Telehealth: Payer: Self-pay | Admitting: *Deleted

## 2020-03-18 NOTE — Telephone Encounter (Signed)
Clearance and xarelto addressed in Dr. Mallory Shirk note dated 03/11/20. I will fax this to the requesting office.

## 2020-03-18 NOTE — Telephone Encounter (Signed)
° °   [facility] Pre-operative Risk Assessment    HEARTCARE STAFF: - Please ensure there is not already an duplicate clearance open for this procedure. - Under Visit Info/Reason for Call, type in Other and utilize the format Clearance MM/DD/YY or Clearance TBD. Do not use dashes or single digits. - If request is for dental extraction, please clarify the # of teeth to be extracted.  Request for surgical clearance:  1. What type of surgery is being performed? Colonoscopy and EGD   2. When is this surgery scheduled? TBD-urgent   3. What type of clearance is required (medical clearance vs. Pharmacy clearance to hold med vs. Both)? both  4. Are there any medications that need to be held prior to surgery and how long?xarelto-2-3 days prior   5. Practice name and name of physician performing surgery? [facility]   6. What is the office phone number? [phone]   7.   What is the office fax number? [phone]  8.   Anesthesia type (None, local, MAC, general) ? Not listed   Fredia Beets 03/18/2020, 7:42 AM  _________________________________________________________________   (provider comments below)

## 2020-03-22 ENCOUNTER — Telehealth: Payer: Self-pay | Admitting: Gastroenterology

## 2020-03-22 NOTE — Telephone Encounter (Signed)
Pt's spouse the pt was seen in the ED for a hiatal hernia, pt was recommended by the provider to have an EGD as well as a colonoscopy. Pt would like to discuss this further with the nurse.

## 2020-03-24 NOTE — Telephone Encounter (Signed)
Absolutely I am glad she is getting it done Thanks for letting me know  RG

## 2020-03-24 NOTE — Telephone Encounter (Signed)
I have called and spoke to patients husband, he just wanted Dr. Chales Abrahams to know that she was going to have her procedures done in [facility] by another provider.

## 2020-06-14 ENCOUNTER — Encounter: Payer: Self-pay | Admitting: Cardiology

## 2020-06-14 ENCOUNTER — Other Ambulatory Visit: Payer: Self-pay

## 2020-06-14 ENCOUNTER — Ambulatory Visit: Payer: Medicare HMO | Admitting: Cardiology

## 2020-06-14 VITALS — BP 134/70 | HR 88 | Ht 61.0 in | Wt 111.0 lb

## 2020-06-14 DIAGNOSIS — E782 Mixed hyperlipidemia: Secondary | ICD-10-CM | POA: Diagnosis not present

## 2020-06-14 DIAGNOSIS — I480 Paroxysmal atrial fibrillation: Secondary | ICD-10-CM | POA: Diagnosis not present

## 2020-06-14 DIAGNOSIS — Z79899 Other long term (current) drug therapy: Secondary | ICD-10-CM | POA: Diagnosis not present

## 2020-06-14 NOTE — Patient Instructions (Signed)

## 2020-06-14 NOTE — Progress Notes (Addendum)
Cardiology Office Note:    Date:  06/14/2020   ID:  Christy Gomez, DOB [birth year], MRN [id number]  PCP:  Christy Dimmer, FNP  Cardiologist:  Thomasene Ripple, DO  Electrophysiologist:  None   Referring MD: Selena Batten *   " I am doing well"   History of Present Illness:    Christy Gomez is a 79 y.o. female with a hx of paroxysmal atrial fibrillation on Xarelto, essential tremors, hyperlipidemia presents today for follow-up visit.    I did see the patient on 02/02/2020 at that time she was on Xarelto and had recently been diagnosed with atrial fibrillation.  I continued patient on Xarelto, additionally I recommended consideration for watchman given her history of essential tremors but the patient did not want to discuss this further at that time.  She was seen on 03/11/2020, at that she was planning for here endoscopy.  Today she is here today for a follow up visit.   Past Medical History:  Diagnosis Date  . Atrial fibrillation (HCC)   . Benign familial tremor   . Cerebellar vertigo, unspecified laterality   . Disabling essential tremor   . Diverticulitis   . Hiatal hernia   . Mixed hyperlipidemia   . Osteoporosis     Past Surgical History:  Procedure Laterality Date  . CARPAL TUNNEL RELEASE Right 2014  . CATARACT EXTRACTION W/ INTRAOCULAR LENS  IMPLANT, BILATERAL  2012    Current Medications: Current Meds  Medication Sig  . acetaminophen (TYLENOL) 325 MG tablet Take 1 tablet by mouth daily.  Marland Kitchen aspirin 81 MG EC tablet Take 1 tablet by mouth daily.  . Cholecalciferol 50 MCG (2000 UT) CAPS Take 1 capsule by mouth daily.  . clonazePAM (KLONOPIN) 1 MG tablet Take by mouth. Take 0.5 - 1 tablet by mouth 2 times daily as needed for anxiety (tremor)  . esomeprazole (NEXIUM) 20 MG capsule Take 1 capsule by mouth daily.  . fluticasone (FLONASE) 50 MCG/ACT nasal spray Place 1 spray into both nostrils as needed.  . meclizine (ANTIVERT) 12.5 MG tablet Take 1  tablet by mouth daily.  . metoprolol tartrate (LOPRESSOR) 25 MG tablet Take 1 tablet by mouth daily.  . ondansetron (ZOFRAN-ODT) 4 MG disintegrating tablet Take 1 tablet by mouth as needed.   . predniSONE (DELTASONE) 50 MG tablet Take 1 tablet by mouth daily.   . rivaroxaban (XARELTO) 20 MG TABS tablet Take 1 tablet (20 mg total) by mouth daily with supper.     Allergies:   Diazepam, Levofloxacin, Propranolol, Ciprofloxacin, Gabapentin, Metronidazole, Other, Oxycodone-acetaminophen, Primidone, Sertraline, Statins, Topiramate, Acetaminophen, Hydrocodone, and Colesevelam   Social History   Socioeconomic History  . Marital status: Married    Spouse name: Not on file  . Number of children: Not on file  . Years of education: Not on file  . Highest education level: Not on file  Occupational History  . Not on file  Tobacco Use  . Smoking status: Never Smoker  . Smokeless tobacco: Never Used  Substance and Sexual Activity  . Alcohol use: Never  . Drug use: Never  . Sexual activity: Not on file  Other Topics Concern  . Not on file  Social History Narrative  . Not on file   Social Determinants of Health   Financial Resource Strain:   . Difficulty of Paying Living Expenses: Not on file  Food Insecurity:   . Worried About Programme researcher, broadcasting/film/video in the Last Year: Not on file  .  Ran Out of Food in the Last Year: Not on file  Transportation Needs:   . Lack of Transportation (Medical): Not on file  . Lack of Transportation (Non-Medical): Not on file  Physical Activity:   . Days of Exercise per Week: Not on file  . Minutes of Exercise per Session: Not on file  Stress:   . Feeling of Stress : Not on file  Social Connections:   . Frequency of Communication with Friends and Family: Not on file  . Frequency of Social Gatherings with Friends and Family: Not on file  . Attends Religious Services: Not on file  . Active Member of Clubs or Organizations: Not on file  . Attends Tax inspector Meetings: Not on file  . Marital Status: Not on file     Family History: The patient's family history includes Colon cancer in her mother; Tremor in her maternal grandmother, mother, and another family member.  ROS:   Review of Systems  Constitution: Negative for decreased appetite, fever and weight gain.  HENT: Negative for congestion, ear discharge, hoarse voice and sore throat.   Eyes: Negative for discharge, redness, vision loss in right eye and visual halos.  Cardiovascular: Negative for chest pain, dyspnea on exertion, leg swelling, orthopnea and palpitations.  Respiratory: Negative for cough, hemoptysis, shortness of breath and snoring.   Endocrine: Negative for heat intolerance and polyphagia.  Hematologic/Lymphatic: Negative for bleeding problem. Does not bruise/bleed easily.  Skin: Negative for flushing, nail changes, rash and suspicious lesions.  Musculoskeletal: Negative for arthritis, joint pain, muscle cramps, myalgias, neck pain and stiffness.  Gastrointestinal: Negative for abdominal pain, bowel incontinence, diarrhea and excessive appetite.  Genitourinary: Negative for decreased libido, genital sores and incomplete emptying.  Neurological: Negative for brief paralysis, focal weakness, headaches and loss of balance.  Psychiatric/Behavioral: Negative for altered mental status, depression and suicidal ideas.  Allergic/Immunologic: Negative for HIV exposure and persistent infections.    EKGs/Labs/Other Studies Reviewed:    The following studies were reviewed today:   EKG:  None today  Echo IMPRESSIONS 02/13/2020 1. Left ventricular ejection fraction, by estimation, is 60 to 65%. The left ventricle has normal function. The left ventricle has no regional wall motion abnormalities. Left ventricular diastolic parameters are  indeterminate.  2. Right ventricular systolic function is normal. The right ventricular size is normal. There is normal pulmonary artery  systolic pressure.  3. The mitral valve is normal in structure. Trivial mitral valve regurgitation. No evidence of mitral stenosis.  4. Tricuspid valve regurgitation is mild to moderate.  5. The aortic valve is normal in structure. Aortic valve regurgitation is trivial. No aortic stenosis is present.  6. The inferior vena cava is normal in size with greater than 50% respiratory variability, suggesting right atrial pressure of 3 mmHg   Recent Labs: No results found for requested labs within last 8760 hours.  Recent Lipid Panel No results found for: CHOL, TRIG, HDL, CHOLHDL, VLDL, LDLCALC, LDLDIRECT  Physical Exam:    VS:  BP 134/70   Pulse 88   Ht 5\' 1"  (1.549 m)   Wt 111 lb (50.3 kg)   SpO2 95%   BMI 20.97 kg/m     Wt Readings from Last 3 Encounters:  06/14/20 111 lb (50.3 kg)  03/11/20 125 lb (56.7 kg)  02/02/20 135 lb (61.2 kg)     GEN: Well nourished, well developed in no acute distress HEENT: Normal NECK: No JVD; No carotid bruits LYMPHATICS: No lymphadenopathy CARDIAC: S1S2 noted,RRR,  no murmurs, rubs, gallops RESPIRATORY:  Clear to auscultation without rales, wheezing or rhonchi  ABDOMEN: Soft, non-tender, non-distended, +bowel sounds, no guarding. EXTREMITIES: No edema, No cyanosis, no clubbing MUSCULOSKELETAL:  No deformity  SKIN: Warm and dry NEUROLOGIC:  Alert and oriented x 3, non-focal PSYCHIATRIC:  Normal affect, good insight  ASSESSMENT:    1. Paroxysmal atrial fibrillation (HCC)   2. High risk medication use   3. Mixed hyperlipidemia    PLAN:     She appears to be doing well from a cardiovascular standpoint.  She had questions today about the Xarelto as we had planned to discuss referral for watchman implantation.  She and her husband are both here and are interested in considering this modality.  One of the biggest reason why the patient would be a candidate for a watchman implantation is secondary to the fact that her tremors are getting worse  and she is worried that she could potentially fall and have a significant intracranial bleeding.  She is able to tolerate anticoagulation post watchman implantation  I have seen Christy Gomez in the office today.  I will refer her for consideration for Left Atrial Appendage Closure with the Watchman Device for management of stroke risk. Based upon past history, it has been determined that she is a poor candidate for long-term oral anticoagulation.  However, she may be tolerant of short-term treatment with an anticoagulant as necessary.  A shared decision has been made for the patient to be evaluated by EP. She will discuss the details of the procedure with EP utilizing the [employer] shared decision tool to undergo Left Atrial Appendage Closure with Watchman device at this time.    Explained to the patient that I will refer her to my EP colleagues at that time she is going to discuss full details with them.  For now given her chads Vascor of 3 she is going to continue with her Xarelto.  The patient is in agreement with the above plan. The patient left the office in stable condition.  The patient will follow up in 6 months or sooner if needed.   Medication Adjustments/Labs and Tests Ordered: Current medicines are reviewed at length with the patient today.  Concerns regarding medicines are outlined above.  Orders Placed This Encounter  Procedures  . Ambulatory referral to Cardiac Electrophysiology   No orders of the defined types were placed in this encounter.   Patient Instructions  Medication Instructions:  Your physician recommends that you continue on your current medications as directed. Please refer to the Current Medication list given to you today.  *If you need a refill on your cardiac medications before your next appointment, please call your pharmacy*   Lab Work: None If you have labs (blood work) drawn today and your tests are completely  normal, you will receive your results only by: Marland Kitchen MyChart Message (if you have MyChart) OR . A paper copy in the mail If you have any lab test that is abnormal or we need to change your treatment, we will call you to review the results.   Testing/Procedures: None   Follow-Up: At [facility], you and your health needs are our priority.  As part of our continuing mission to provide you with exceptional heart care, we have created designated Provider Care Teams.  These Care Teams include your primary Cardiologist (physician) and Advanced Practice Providers (APPs -  Physician Assistants and Nurse Practitioners) who all work together to provide  you with the care you need, when you need it.  We recommend signing up for the patient portal called "MyChart".  Sign up information is provided on this After Visit Summary.  MyChart is used to connect with patients for Virtual Visits (Telemedicine).  Patients are able to view lab/test results, encounter notes, upcoming appointments, etc.  Non-urgent messages can be sent to your provider as well.   To learn more about what you can do with MyChart, go to [email or web address].    Your next appointment:   6 month(s)  The format for your next appointment:   In Person  Provider:   Thomasene RippleKardie Tinsleigh Slovacek, DO   Other Instructions      Adopting a Healthy Lifestyle.  Know what a healthy weight is for you (roughly BMI <25) and aim to maintain this   Aim for 7+ servings of fruits and vegetables daily   65-80+ fluid ounces of water or unsweet tea for healthy kidneys   Limit to max 1 drink of alcohol per day; avoid smoking/tobacco   Limit animal fats in diet for cholesterol and heart health - choose grass fed whenever available   Avoid highly processed foods, and foods high in saturated/trans fats   Aim for low stress - take time to unwind and care for your mental health   Aim for 150 min of moderate intensity exercise weekly for heart health, and  weights twice weekly for bone health   Aim for 7-9 hours of sleep daily   When it comes to diets, agreement about the perfect plan isnt easy to find, even among the experts. Experts at the [employer] [facility] [employer] developed an idea known as the Healthy Eating Plate. Just imagine a plate divided into logical, healthy portions.   The emphasis is on diet quality:   Load up on vegetables and fruits - one-half of your plate: Aim for color and variety, and remember that potatoes dont count.   Go for whole grains - one-quarter of your plate: Whole wheat, barley, wheat berries, quinoa, oats, brown rice, and foods made with them. If you want pasta, go with whole wheat pasta.   Protein power - one-quarter of your plate: Fish, chicken, beans, and nuts are all healthy, versatile protein sources. Limit red meat.   The diet, however, does go beyond the plate, offering a few other suggestions.   Use healthy plant oils, such as olive, canola, soy, corn, sunflower and peanut. Check the labels, and avoid partially hydrogenated oil, which have unhealthy trans fats.   If youre thirsty, drink water. Coffee and tea are good in moderation, but skip sugary drinks and limit milk and dairy products to one or two daily servings.   The type of carbohydrate in the diet is more important than the amount. Some sources of carbohydrates, such as vegetables, fruits, whole grains, and beans-are healthier than others.   Finally, stay active  Signed, Thomasene RippleKardie Zakye Baby, DO  06/14/2020 2:49 PM    [facility]

## 2020-06-25 NOTE — Telephone Encounter (Signed)
Pt agreeable to sooner appt in [place] office, 12/9

## 2020-07-01 ENCOUNTER — Institutional Professional Consult (permissible substitution): Payer: Medicare HMO | Admitting: Cardiology

## 2020-07-01 NOTE — Progress Notes (Deleted)
Electrophysiology Office Note   Date:  07/01/2020   ID:  Christy Gomez, DOB 1940/11/03, MRN [id number]  PCP:  Laurel Dimmer, FNP  Cardiologist:  Servando [place] Primary Electrophysiologist:  Willistine Ferrall Jorja Loa, MD    Chief Complaint: AF   History of Present Illness: Christy Gomez is a 79 y.o. female who is being seen today for the evaluation of AF at the request of Tobb, Kardie, DO. Presenting today for electrophysiology evaluation.  She has a history significant for paroxysmal atrial fibrillation, hyperlipidemia.  She was started on Xarelto after being diagnosed with atrial fibrillation.  Today, she denies*** symptoms of palpitations, chest pain, shortness of breath, orthopnea, PND, lower extremity edema, claudication, dizziness, presyncope, syncope, bleeding, or neurologic sequela. The patient is tolerating medications without difficulties.    Past Medical History:  Diagnosis Date  . Atrial fibrillation (HCC)   . Benign familial tremor   . Cerebellar vertigo, unspecified laterality   . Disabling essential tremor   . Diverticulitis   . Hiatal hernia   . Mixed hyperlipidemia   . Osteoporosis    Past Surgical History:  Procedure Laterality Date  . CARPAL TUNNEL RELEASE Right 2014  . CATARACT EXTRACTION W/ INTRAOCULAR LENS  IMPLANT, BILATERAL  2012     Current Outpatient Medications  Medication Sig Dispense Refill  . acetaminophen (TYLENOL) 325 MG tablet Take 1 tablet by mouth daily.    Marland Kitchen aspirin 81 MG EC tablet Take 1 tablet by mouth daily.    . Cholecalciferol 50 MCG (2000 UT) CAPS Take 1 capsule by mouth daily.    . clonazePAM (KLONOPIN) 1 MG tablet Take by mouth. Take 0.5 - 1 tablet by mouth 2 times daily as needed for anxiety (tremor)    . esomeprazole (NEXIUM) 20 MG capsule Take 1 capsule by mouth daily.    . fluticasone (FLONASE) 50 MCG/ACT nasal spray Place 1 spray into both nostrils as needed.    . meclizine (ANTIVERT) 12.5 MG tablet Take 1 tablet by mouth  daily.    . metoprolol tartrate (LOPRESSOR) 25 MG tablet Take 1 tablet by mouth daily.    . ondansetron (ZOFRAN-ODT) 4 MG disintegrating tablet Take 1 tablet by mouth as needed.     . predniSONE (DELTASONE) 50 MG tablet Take 1 tablet by mouth daily.     . rivaroxaban (XARELTO) 20 MG TABS tablet Take 1 tablet (20 mg total) by mouth daily with supper. 30 tablet 12   No current facility-administered medications for this visit.    Allergies:   Diazepam, Levofloxacin, Propranolol, Ciprofloxacin, Gabapentin, Metronidazole, Other, Oxycodone-acetaminophen, Primidone, Sertraline, Statins, Topiramate, Acetaminophen, Hydrocodone, and Colesevelam   Social History:  The patient  reports that she has never smoked. She has never used smokeless tobacco. She reports that she does not drink alcohol and does not use drugs.   Family History:  The patient's family history includes Colon cancer in her mother; Tremor in her maternal grandmother, mother, and another family member.    ROS:  Please see the history of present illness.   Otherwise, review of systems is positive for none.   All other systems are reviewed and negative.    PHYSICAL EXAM: VS:  There were no vitals taken for this visit. , BMI There is no height or weight on file to calculate BMI. GEN: Well nourished, well developed, in no acute distress  HEENT: normal  Neck: no JVD, carotid bruits, or masses Cardiac: ***RRR; no murmurs, rubs, or gallops,no edema  Respiratory:  clear to auscultation bilaterally, normal work of breathing GI: soft, nontender, nondistended, + BS MS: no deformity or atrophy  Skin: warm and dry Neuro:  Strength and sensation are intact Psych: euthymic mood, full affect  EKG:  EKG {ACTION; IS/IS [id number]} ordered today. Personal review of the ekg ordered *** shows ***  Recent Labs: No results found for requested labs within last 8760 hours.    Lipid Panel  No results found for: CHOL, TRIG, HDL, CHOLHDL, VLDL,  LDLCALC, LDLDIRECT   Wt Readings from Last 3 Encounters:  06/14/20 111 lb (50.3 kg)  03/11/20 125 lb (56.7 kg)  02/02/20 135 lb (61.2 kg)      Other studies Reviewed: Additional studies/ records that were reviewed today include: TTE 02/13/20  Review of the above records today demonstrates:  1. Left ventricular ejection fraction, by estimation, is 60 to 65%. The  left ventricle has normal function. The left ventricle has no regional  wall motion abnormalities. Left ventricular diastolic parameters are  indeterminate.  2. Right ventricular systolic function is normal. The right ventricular  size is normal. There is normal pulmonary artery systolic pressure.  3. The mitral valve is normal in structure. Trivial mitral valve  regurgitation. No evidence of mitral stenosis.  4. Tricuspid valve regurgitation is mild to moderate.  5. The aortic valve is normal in structure. Aortic valve regurgitation is  trivial. No aortic stenosis is present.  6. The inferior vena cava is normal in size with greater than 50%  respiratory variability, suggesting right atrial pressure of 3 mmHg.    ASSESSMENT AND PLAN:  1.  Atrial fibrillation: Currently on Xarelto.  CHA2DS2-VASc of 3.  High risk medication monitoring.    Current medicines are reviewed at length with the patient today.   The patient {ACTIONS; HAS/DOES NOT HAVE:19233} concerns regarding her medicines.  The following changes were made today:  {NONE DEFAULTED:18576::"none"}  Labs/ tests ordered today include: *** No orders of the defined types were placed in this encounter.    Disposition:   FU with Ivionna Verley {gen number [id number]} {Days to years:10300}  Signed, Haile Bosler Jorja Loa, MD  07/01/2020 8:40 AM     [facility] [address] [place] [address] [phone] (office) [phone] (fax)

## 2020-07-06 ENCOUNTER — Ambulatory Visit (INDEPENDENT_AMBULATORY_CARE_PROVIDER_SITE_OTHER): Payer: Medicare HMO | Admitting: Cardiology

## 2020-07-06 ENCOUNTER — Other Ambulatory Visit: Payer: Self-pay

## 2020-07-06 ENCOUNTER — Encounter: Payer: Self-pay | Admitting: Cardiology

## 2020-07-06 VITALS — BP 116/74 | HR 88 | Ht 61.0 in | Wt 107.0 lb

## 2020-07-06 DIAGNOSIS — R251 Tremor, unspecified: Secondary | ICD-10-CM | POA: Diagnosis not present

## 2020-07-06 DIAGNOSIS — I480 Paroxysmal atrial fibrillation: Secondary | ICD-10-CM | POA: Diagnosis not present

## 2020-07-06 NOTE — Patient Instructions (Signed)
Medication Instructions:  Your physician recommends that you continue on your current medications as directed. Please refer to the Current Medication list given to you today.  *If you need a refill on your cardiac medications before your next appointment, please call your pharmacy*   Lab Work: CBC today  If you have labs (blood work) drawn today and your tests are completely normal, you will receive your results only by: Marland Kitchen MyChart Message (if you have MyChart) OR . A paper copy in the mail If you have any lab test that is abnormal or we need to change your treatment, we will call you to review the results.   Testing/Procedures: None ordered.    Follow-Up: At [facility], you and your health needs are our priority.  As part of our continuing mission to provide you with exceptional heart care, we have created designated Provider Care Teams.  These Care Teams include your primary Cardiologist (physician) and Advanced Practice Providers (APPs -  Physician Assistants and Nurse Practitioners) who all work together to provide you with the care you need, when you need it.  We recommend signing up for the patient portal called "MyChart".  Sign up information is provided on this After Visit Summary.  MyChart is used to connect with patients for Virtual Visits (Telemedicine).  Patients are able to view lab/test results, encounter notes, upcoming appointments, etc.  Non-urgent messages can be sent to your provider as well.   To learn more about what you can do with MyChart, go to [email or web address].    Your next appointment:   To be determined   Other Instructions Referral to Neurology

## 2020-07-06 NOTE — Progress Notes (Signed)
Watchman Consult Note   Date:  07/06/2020   ID:  Christy Gomez, DOB 1941-01-20, MRN [id number]  PCP:  Laurel Dimmer, FNP  Cardiologist:  Dr Servando Salina Primary Electrophysiologist: Lanier Prude Referring Physician: Thomasene Ripple, DO   Chief complaint: atrial fibrillation and coagulation management; Watchman implant    History of Present Illness: Christy Gomez is a 79 y.o. female referred by Dr Servando Salina for evaluation of atrial fibrillation and stroke prevention. She has paroxysmal atrial fibrillation as well as severe tremor and gait instability, hyperlipidemia.  The patient has been evaluated by their referring physician and is felt to be a poor candidate for long term OAC due to gait instability and severe tremors.  She therefore presents today for Watchman evaluation.   Today we spent a lot of time discussing her severe tremors.  They involve all 4 extremities and her head neck.  They are disabling and lead to significant gait instability.  She is very fearful of falling.  She has trouble feeding herself and has to be fed by her husband who is with her during the appointment today.  She also tells me that she has dark stools and is concerned that she may be bleeding internally.  She had a recent colonoscopy in September 2021 during which biopsies were taken to rule out celiac disease.  These returned negative for celiac disease.  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  Today, she denies symptoms of palpitations, chest pain, shortness of breath, orthopnea, PND, lower extremity edema, claudication, dizziness, presyncope, syncope, bleeding. The patient is tolerating medications without difficulties and is otherwise without complaint today.    Past Medical History:  Diagnosis Date  . Atrial fibrillation (HCC)   . Benign familial tremor   . Cerebellar vertigo, unspecified laterality   . Disabling essential tremor   . Diverticulitis   . Hiatal hernia   . Mixed  hyperlipidemia   . Osteoporosis    Past Surgical History:  Procedure Laterality Date  . CARPAL TUNNEL RELEASE Right 2014  . CATARACT EXTRACTION W/ INTRAOCULAR LENS  IMPLANT, BILATERAL  2012     Current Outpatient Medications  Medication Sig Dispense Refill  . acetaminophen (TYLENOL) 325 MG tablet Take 1 tablet by mouth daily.    . Cholecalciferol 50 MCG (2000 UT) CAPS Take 1 capsule by mouth daily.    . clonazePAM (KLONOPIN) 1 MG tablet Take 0.5 mg by mouth daily. Take 0.5 - 1 tablet by mouth 2 times daily as needed for anxiety (tremor)    . fluticasone (FLONASE) 50 MCG/ACT nasal spray Place 1 spray into both nostrils as needed.    . rivaroxaban (XARELTO) 20 MG TABS tablet Take 1 tablet (20 mg total) by mouth daily with supper. 30 tablet 12   No current facility-administered medications for this visit.    Allergies:   Diazepam, Levofloxacin, Propranolol, Ciprofloxacin, Gabapentin, Metronidazole, Other, Oxycodone-acetaminophen, Primidone, Sertraline, Statins, Topiramate, Acetaminophen, Hydrocodone, and Colesevelam   Social History:  The patient  reports that she has never smoked. She has never used smokeless tobacco. She reports that she does not drink alcohol and does not use drugs.   Family History:  The patient's  family history includes Colon cancer in her mother; Tremor in her maternal grandmother, mother, and another family member.    ROS:  Please see the history of present illness.   All other systems are reviewed and negative.    PHYSICAL EXAM: VS:  BP 116/74   Pulse  88   Ht 5\' 1"  (1.549 m)   Wt 107 lb (48.5 kg)   SpO2 98%   BMI 20.22 kg/m  , BMI Body mass index is 20.22 kg/m.   GEN: Thin, elderly appearing, severe tremors HEENT: normal  Neck: no JVD, carotid bruits, or masses Cardiac: RRR; no murmurs, rubs, or gallops,no edema  Respiratory:  clear to auscultation bilaterally, normal work of breathing GI: soft, nontender, nondistended, + BS MS: no deformity or  atrophy  Skin: warm and dry  Neuro:  Strength and sensation are intact Psych: euthymic mood, full affect    Recent Labs: No results found for requested labs within last 8760 hours.    Lipid Panel  No results found for: CHOL, TRIG, HDL, CHOLHDL, VLDL, LDLCALC, LDLDIRECT   Wt Readings from Last 3 Encounters:  07/06/20 107 lb (48.5 kg)  06/14/20 111 lb (50.3 kg)  03/11/20 125 lb (56.7 kg)      Other studies Reviewed:  February 13, 2020 echo personally reviewed Left ventricular function normal, 60% Right ventricular function normal Mild to moderate tricuspid regurgitation      ASSESSMENT AND PLAN:  1.  Paroxysmal atrial fibrillation I have seen February 15, 2020 Boesen in the office today who is being considered for a Watchman left atrial appendage closure device. I believe they will benefit from this procedure given their history of atrial fibrillation, CHA2DS2-VASc score of 3 and unadjusted ischemic stroke rate of 3.2% per year. Unfortunately, the patient is not felt to be a long term anticoagulation candidate secondary to severe tremors and gait instability. The patient's chart has been reviewed and I feel that they would be a candidate for short term oral anticoagulation after Watchman implant.   It is my belief that after undergoing a LAA closure procedure, Christy Gomez will not need long term anticoagulation which eliminates anticoagulation side effects and major bleeding risk.   Procedural risks for the Watchman implant have been reviewed with the patient including a 0.5% risk of stroke, <1% risk of perforation and <1% risk of device embolization.    The published clinical data on the safety and effectiveness of WATCHMAN include but are not limited to the following: - Holmes DR, Eugenio Hoes, Sick P et al. for the PROTECT AF Investigators. Percutaneous closure of the left atrial appendage versus warfarin therapy for prevention of stroke in patients with atrial fibrillation: a  randomised non-inferiority trial. Lancet 2009; 374: 534-42. 03-26-1976, Doshi SK, Everlene Farrier D et al. on behalf of the PROTECT AF Investigators. Percutaneous Left Atrial Appendage Closure for Stroke Prophylaxis in Patients With Atrial Fibrillation 2.3-Year Follow-up of the PROTECT AF (Watchman Left Atrial Appendage System for Embolic Protection in Patients With Atrial Fibrillation) Trial. Circulation 2013; 127:720-729. - Alli O, Doshi S,  Kar S, Reddy VY, Sievert H et al. Quality of Life Assessment in the Randomized PROTECT AF (Percutaneous Closure of the Left Atrial Appendage Versus Warfarin Therapy for Prevention of Stroke in Patients With Atrial Fibrillation) Trial of Patients at Risk for Stroke With Nonvalvular Atrial Fibrillation. J Am Coll Cardiol 2013; 61:1790-8. 2014 DR, Aline August, Price M, Whisenant B, Sievert H, Doshi S, Huber K, Reddy V. Prospective randomized evaluation of the Watchman left atrial appendage Device in patients with atrial fibrillation versus long-term warfarin therapy; the PREVAIL trial. Journal of the 05-11-1999 of Cardiology, Vol. 4, No. 1, 2014, 1-11. - Kar S, Doshi SK, Sadhu A, Horton R, Osorio J et al. Primary outcome evaluation of  a next-generation left atrial appendage closure device: results from the PINNACLE FLX trial. Circulation 2021;143(18)1754-1762.    After today's visit with the patient which was dedicated solely for shared decision making visit regarding LAA closure device, the patient decided to proceed with the LAA appendage closure procedure scheduled to be done in the near future at [facility]. Prior to the procedure, I would like to obtain a gated CT scan of the chest with contrast timed for PV/LA visualization.     2.  Tremors Her tremors are severe and disabling.  She reportedly has an intolerance to primidone and propranolol.  I would like to refer her to our neurology colleagues for an evaluation of her tremors to confirm the  diagnosis and also discussed possible treatment options.   Signed, Rossie Muskrat. Lalla Brothers, MD, [facility] Cardiac Electrophysiology  [facility] [address] [place] [address] [phone] (office) [phone] (fax)

## 2020-07-07 LAB — CBC
Hematocrit: 38.3 % (ref 34.0–46.6)
Hemoglobin: 12.7 g/dL (ref 11.1–15.9)
MCH: 30 pg (ref 26.6–33.0)
MCHC: 33.2 g/dL (ref 31.5–35.7)
MCV: 90 fL (ref 79–97)
Platelets: 240 10*3/uL (ref 150–450)
RBC: 4.24 x10E6/uL (ref 3.77–5.28)
RDW: 12 % (ref 11.7–15.4)
WBC: 6.7 10*3/uL (ref 3.4–10.8)

## 2020-07-12 ENCOUNTER — Telehealth: Payer: Self-pay

## 2020-07-12 DIAGNOSIS — I480 Paroxysmal atrial fibrillation: Secondary | ICD-10-CM

## 2020-07-12 DIAGNOSIS — R251 Tremor, unspecified: Secondary | ICD-10-CM

## 2020-07-12 DIAGNOSIS — Z79899 Other long term (current) drug therapy: Secondary | ICD-10-CM

## 2020-07-12 NOTE — Telephone Encounter (Signed)
Husband is going to contact insurance to check on out of pocket cost for Watchman procedure.  He will call this nurse back.

## 2020-07-12 NOTE — Telephone Encounter (Signed)
Return call received.  Family able to afford procedure after discussion with their insurance company.  Pt does not need an additional echo per Dr. Lalla Brothers.  (Last echo July 2021).  Order placed for BMP and cardiac CT.  Will meet with family to get lab work and discuss CT instructions.

## 2020-07-14 ENCOUNTER — Other Ambulatory Visit: Payer: Medicare HMO | Admitting: *Deleted

## 2020-07-14 ENCOUNTER — Other Ambulatory Visit: Payer: Self-pay

## 2020-07-14 DIAGNOSIS — Z79899 Other long term (current) drug therapy: Secondary | ICD-10-CM

## 2020-07-14 DIAGNOSIS — I480 Paroxysmal atrial fibrillation: Secondary | ICD-10-CM

## 2020-07-14 DIAGNOSIS — R251 Tremor, unspecified: Secondary | ICD-10-CM

## 2020-07-14 LAB — BASIC METABOLIC PANEL
BUN/Creatinine Ratio: 15 (ref 12–28)
BUN: 14 mg/dL (ref 8–27)
CO2: 25 mmol/L (ref 20–29)
Calcium: 9.6 mg/dL (ref 8.7–10.3)
Chloride: 100 mmol/L (ref 96–106)
Creatinine, Ser: 0.94 mg/dL (ref 0.57–1.00)
GFR calc Af Amer: 68 mL/min/{1.73_m2} (ref >59)
GFR calc non Af Amer: 59 mL/min/{1.73_m2} — ABNORMAL LOW (ref >59)
Glucose: 97 mg/dL (ref 65–99)
Potassium: 3.7 mmol/L (ref 3.5–5.2)
Sodium: 138 mmol/L (ref 134–144)

## 2020-07-14 MED ORDER — METOPROLOL TARTRATE 50 MG PO TABS: ORAL_TABLET | ORAL | 0 refills | Status: DC

## 2020-07-19 ENCOUNTER — Institutional Professional Consult (permissible substitution): Payer: Medicare HMO | Admitting: Cardiology

## 2020-07-20 ENCOUNTER — Telehealth (HOSPITAL_COMMUNITY): Payer: Self-pay | Admitting: *Deleted

## 2020-07-20 NOTE — Telephone Encounter (Signed)
Reaching out to patient to offer assistance regarding upcoming cardiac imaging study; pt verbalizes understanding of appt date/time, parking situation and where to check in, pre-test NPO status and medications ordered, and verified current allergies; name and call back number provided for further questions should they arise  Elvin Mccartin Tai RN Navigator Cardiac Imaging [facility] [phone] office [phone] cell 

## 2020-07-22 ENCOUNTER — Ambulatory Visit (HOSPITAL_COMMUNITY)
Admission: RE | Admit: 2020-07-22 | Discharge: 2020-07-22 | Disposition: A | Discharge status: Home / Self Care | Payer: Medicare HMO | Source: Ambulatory Visit | Attending: Cardiology | Admitting: Cardiology

## 2020-07-22 ENCOUNTER — Other Ambulatory Visit: Payer: Self-pay

## 2020-07-22 DIAGNOSIS — I480 Paroxysmal atrial fibrillation: Secondary | ICD-10-CM | POA: Diagnosis not present

## 2020-07-22 DIAGNOSIS — R251 Tremor, unspecified: Secondary | ICD-10-CM | POA: Insufficient documentation

## 2020-07-22 DIAGNOSIS — Z79899 Other long term (current) drug therapy: Secondary | ICD-10-CM | POA: Insufficient documentation

## 2020-07-22 MED ORDER — IOHEXOL 350 MG/ML SOLN
80.0000 mL | Freq: Once | INTRAVENOUS | Status: AC | PRN
  Administered 2020-07-22: 80 mL via INTRAVENOUS

## 2020-08-03 ENCOUNTER — Encounter: Payer: Self-pay | Admitting: Nurse Practitioner

## 2020-08-03 ENCOUNTER — Telehealth: Payer: Self-pay | Admitting: Nurse Practitioner

## 2020-08-03 NOTE — Telephone Encounter (Signed)
Error

## 2020-08-03 NOTE — Telephone Encounter (Signed)
Contacted Mrs. Tutterow to introduce myself and to advise  her of the current delays related to elective procedures. I spent time answering questions and also provided my contact information. I informed her that I will be in contact with any updates pertaining to her procedure date.She was very thankful for the call and is looking forward to her Watchman implant in the near future.  Robin Searing, RN Watchman Coordinator

## 2020-08-11 ENCOUNTER — Telehealth: Payer: Self-pay | Admitting: Nurse Practitioner

## 2020-08-11 NOTE — Telephone Encounter (Signed)
Call placed to Mrs. Fiscus to discuss interest in Watchman procedure date on 08/26/20. Patient in agreement for this date and has elected to proceed with pre procedure work up process and has been advised of all next steps in the work up process. I answered all question related to the work up process and patient eager to have procedure completed.  Robin Searing, RN Watchman Coordinator

## 2020-08-16 ENCOUNTER — Telehealth: Payer: Self-pay | Admitting: Nurse Practitioner

## 2020-08-16 NOTE — Telephone Encounter (Signed)
Patient contacted regarding appointment at AF clinic on 08/17/2020. Patient appreciated the call today and had no questions regarding upcoming appointment.

## 2020-08-17 ENCOUNTER — Ambulatory Visit (HOSPITAL_COMMUNITY)
Admission: RE | Admit: 2020-08-17 | Discharge: 2020-08-17 | Disposition: A | Discharge status: Home / Self Care | Payer: Medicare HMO | Source: Ambulatory Visit | Attending: Physician Assistant | Admitting: Physician Assistant

## 2020-08-17 ENCOUNTER — Other Ambulatory Visit: Payer: Self-pay

## 2020-08-17 ENCOUNTER — Encounter (HOSPITAL_COMMUNITY): Payer: Self-pay | Admitting: Physician Assistant

## 2020-08-17 VITALS — BP 124/80 | HR 67 | Ht 61.0 in | Wt 110.2 lb

## 2020-08-17 DIAGNOSIS — I4891 Unspecified atrial fibrillation: Secondary | ICD-10-CM | POA: Insufficient documentation

## 2020-08-17 DIAGNOSIS — I4510 Unspecified right bundle-branch block: Secondary | ICD-10-CM | POA: Diagnosis not present

## 2020-08-17 DIAGNOSIS — D6869 Other thrombophilia: Secondary | ICD-10-CM | POA: Diagnosis not present

## 2020-08-17 DIAGNOSIS — Z7901 Long term (current) use of anticoagulants: Secondary | ICD-10-CM | POA: Insufficient documentation

## 2020-08-17 DIAGNOSIS — I4819 Other persistent atrial fibrillation: Secondary | ICD-10-CM

## 2020-08-17 LAB — BASIC METABOLIC PANEL
Anion gap: 9 (ref 5–15)
BUN: 13 mg/dL (ref 8–23)
CO2: 23 mmol/L (ref 22–32)
Calcium: 8.6 mg/dL — ABNORMAL LOW (ref 8.9–10.3)
Chloride: 101 mmol/L (ref 98–111)
Creatinine, Ser: 0.82 mg/dL (ref 0.44–1.00)
GFR, Estimated: >60 mL/min (ref >60)
Glucose, Bld: 103 mg/dL — ABNORMAL HIGH (ref 70–99)
Potassium: 3.9 mmol/L (ref 3.5–5.1)
Sodium: 133 mmol/L — ABNORMAL LOW (ref 135–145)

## 2020-08-17 LAB — CBC
HCT: 34.8 % — ABNORMAL LOW (ref 36.0–46.0)
Hemoglobin: 11.9 g/dL — ABNORMAL LOW (ref 12.0–15.0)
MCH: 30.9 pg (ref 26.0–34.0)
MCHC: 34.2 g/dL (ref 30.0–36.0)
MCV: 90.4 fL (ref 80.0–100.0)
Platelets: 166 10*3/uL (ref 150–400)
RBC: 3.85 MIL/uL — ABNORMAL LOW (ref 3.87–5.11)
RDW: 12.7 % (ref 11.5–15.5)
WBC: 3.1 10*3/uL — ABNORMAL LOW (ref 4.0–10.5)
nRBC: 0 % (ref 0.0–0.2)

## 2020-08-17 NOTE — H&P (View-Only) (Signed)
Primary Care Physician: Laurel Dimmer, FNP Primary Cardiologist: Dr Servando Salina Primary Electrophysiologist: Dr Lalla Brothers Referring Physician: Dr Fredrich Romans is a 80 y.o. female with a history of HLD, severe tremors, and atrial fibrillation who presents for follow up in the [facility]rial [facility]ibr[facility]. Patient was referred to Dr Lalla Brothers from Dr Servando Salina for [facility] evaluation. She was felt to be a poor candidate for long term anticoagulation due to her debilitating tremors and fall risk. Patient is on Xarelto for a CHADS2VASC score of 3. She is unaware of her arrhythmia.   Today, she denies symptoms of palpitations, chest pain, shortness of breath, orthopnea, PND, lower extremity edema, dizziness, presyncope, syncope, snoring, daytime somnolence, bleeding, or neurologic sequela. The patient is tolerating medications without difficulties and is otherwise without complaint today.    Atrial Fibrillation Risk Factors:  she does not have symptoms or diagnosis of sleep apnea. she does not have a history of rheumatic fever.   she has a BMI of Body mass index is 20.82 kg/m.Marland Kitchen Filed Weights   08/17/20 1453  Weight: 50 kg    Family History  Problem Relation Age of Onset   Tremor Mother    Colon cancer Mother    Tremor Maternal Grandmother    Tremor Other      Atrial Fibrillation Management history:  Previous antiarrhythmic drugs: none Previous cardioversions: none Previous ablations: none CHADS2VASC score: 3 Anticoagulation history: Xarelto   Past Medical History:  Diagnosis Date   Atrial fibrillation (HCC)    Benign familial tremor    Cerebellar vertigo, unspecified laterality    Disabling essential tremor    Diverticulitis    Hiatal hernia    Mixed hyperlipidemia    Osteoporosis    Past Surgical History:  Procedure Laterality Date   CARPAL TUNNEL RELEASE Right 2014   CATARACT EXTRACTION W/ INTRAOCULAR LENS  IMPLANT, BILATERAL   2012    Current Outpatient Medications  Medication Sig Dispense Refill   acetaminophen (TYLENOL) 325 MG tablet Take 325 mg by mouth every 6 (six) hours as needed for moderate pain.     clonazePAM (KLONOPIN) 1 MG tablet Take 0.5 mg by mouth daily. Take 0.5 - 1 tablet by mouth 2 times daily as needed for anxiety (tremor)     fluticasone (FLONASE) 50 MCG/ACT nasal spray Place 1 spray into both nostrils daily as needed for allergies.     rivaroxaban (XARELTO) 20 MG TABS tablet Take 1 tablet (20 mg total) by mouth daily with supper. 30 tablet 12   No current facility-administered medications for this encounter.    Allergies  Allergen Reactions   Diazepam Other (See Comments)    Extremely hyperactivity   Levofloxacin Other (See Comments)    Other reaction(s): Arthralgias (intolerance)   Propranolol Other (See Comments)    Pt reports extreme weakness   Ciprofloxacin Other (See Comments)    Felt like she had tendonitis in arms    Gabapentin Other (See Comments)    Pt doesn't remember but can't take.   Metronidazole Other (See Comments)    Unknown    Other Other (See Comments) and Rash    Bone and joint pain and chest pain   Oxycodone-Acetaminophen Other (See Comments)    Mood swings   Primidone Other (See Comments)    Pt doesn't remember but can't take   Sertraline Other (See Comments)    Pt reports that it disturbs her memory.    Statins Other (See Comments)  Bone and joint pain and chest pain   Topiramate Other (See Comments)    Pt reports painful and frequent urination.    Hydrocodone     Other reaction(s): Other (See Comments)   Colesevelam Other (See Comments)    Social History   Socioeconomic History   Marital status: Married    Spouse name: Not on file   Number of children: Not on file   Years of education: Not on file   Highest education level: Not on file  Occupational History   Not on file  Tobacco Use   Smoking status: Never Smoker    Smokeless tobacco: Never Used  Substance and Sexual Activity   Alcohol use: Never   Drug use: Never   Sexual activity: Not on file  Other Topics Concern   Not on file  Social History Narrative   Not on file   Social Determinants of Health   Financial Resource Strain: Not on file  Food Insecurity: Not on file  Transportation Needs: Not on file  Physical Activity: Not on file  Stress: Not on file  Social Connections: Not on file  Intimate Partner Violence: Not on file     ROS- All systems are reviewed and negative except as per the HPI above.  Physical Exam: Vitals:   08/17/20 1453  BP: 124/80  Pulse: 67  Weight: 50 kg  Height: 5\' 1"  (1.549 m)   Exam: Well appearing, alert and conversant, regular work of breathing,  good skin color Eyes- anicteric, neuro- grossly intact, skin- no apparent rash or lesions or cyanosis, mouth- oral mucosa is pink   Wt Readings from Last 3 Encounters:  08/17/20 50 kg  07/06/20 48.5 kg  06/14/20 50.3 kg    EKG today demonstrates  Afib, RBBB Vent. rate 67 BPM QRS duration 116 ms QT/QTc 408/431 ms  Echo 02/13/20 demonstrated  1. Left ventricular ejection fraction, by estimation, is 60 to 65%. The  left ventricle has normal function. The left ventricle has no regional  wall motion abnormalities. Left ventricular diastolic parameters are  indeterminate.  2. Right ventricular systolic function is normal. The right ventricular  size is normal. There is normal pulmonary artery systolic pressure.  3. The mitral valve is normal in structure. Trivial mitral valve  regurgitation. No evidence of mitral stenosis.  4. Tricuspid valve regurgitation is mild to moderate.  5. The aortic valve is normal in structure. Aortic valve regurgitation is  trivial. No aortic stenosis is present.  6. The inferior vena cava is normal in size with greater than 50%  respiratory variability, suggesting right atrial pressure of 3 mmHg.    Epic  records are reviewed at length today  CHA2DS2-VASc Score = 3  The patient's score is based upon: CHF History: No HTN History: No Diabetes History: No Stroke History: No Vascular Disease History: No Age Score: 2 Gender Score: 1      ASSESSMENT AND PLAN: 1. Persistent Atrial Fibrillation (ICD10:  I48.0) The patient's CHA2DS2-VASc score is 3, indicating a 3.2% annual risk of stroke.   I have seen SHEVETTE BESS is a 80 y.o. female in the office today who has been referred for a Watchman left atrial appendage closure device.  She has a history of paroxysmal atrial fibrillation.  This patient's CHA2DS2-VASc Score and unadjusted Ischemic Stroke Rate (% per year) is equal to 3.2 % stroke rate/year from a score of 3 which necessitates long term oral anticoagulation to prevent stroke. Unfortunately, She is not felt  to be a long term anticoagulation candidate secondary to her fall risk with significant tremors.  Patient felt to be a candidate for short term oral anticoagulation.  Procedural risks for the Watchman implant were again reviewed with the patient including a 0.5% risk of stroke, <1% risk of perforation, <1% risk of device embolization. She is scheduled for Watchman implant 08/26/20.  Cardiac CT 07/22/20 showed Left atrial appendage landing zone measures 28 mm x 22 mm in diameter with area 4.93 cm^2 and perimeter 80 mm. Maximum length measures 64mm. Measurements are appropriate for a 71mm Watchman FLX device (maximum diameter 79mm, 20% compression)  Check bmet/CBC today. Check CXR Continue Xarelto 20 mg daily. Will start ASA 81 mg daily on the day of procedure.   2. Secondary Hypercoagulable State (ICD10:  D68.69) The patient is at significant risk for stroke/thromboembolism based upon her CHA2DS2-VASc Score of 3.  Continue Rivaroxaban (Xarelto).    Follow up for Watchman implant 08/27/20.   Jorja Loa PA-C Afib Clinic [facility] [address] [place], [place]  [address] [phone] 08/17/2020 3:07 PM

## 2020-08-17 NOTE — Patient Instructions (Addendum)
Start Aspirin 81mg  on February 3rd    LEFT ATRIAL APPENDAGE CLOSURE INSTRUCTIONS:  You are scheduled for a Left Atrial Appendage Device Implantation on Thursday, February 3rd  1. Please arrive at the [facility] (Main Entrance A) at [facility]: [address] [place], [place] at 730AM   (This time is two hours before your procedure to ensure your preparation). Free valet parking service is available. You are allowed ONE visitor in the waiting room during your procedure. Both you and your guest must wear masks. Special note: Every effort is made to have your procedure done on time. Please understand that emergencies sometimes delay scheduled procedures.  2.   Do not eat or drink after midnight prior to your procedure.     3.   Medication Instructions: Take ONLY the listed medications morning of procedure with a sip of water:  Aspirin 81mg  only    4. Plan for one night stay--bring personal belongings. When you are discharged, you will need someone to drive you home and stay with you for 24 hours.  5. Bring a current list of your medications and current insurance cards.  6. Please wear clothes that are easy to get on and off and wear slip-on shoes.  7. [id number], the [facility], will arrange follow-up for you during your hospital stay and you will be discharged with your appointment dates/times. His direct number is [phone] if you need assistance.  **If you have any questions, do not hesitate to call the office! You will also be contacted the week of your procedure to confirm instructions.**

## 2020-08-17 NOTE — Addendum Note (Signed)
Encounter addended by: Nyal Schachter S, RN on: 08/17/2020 4:13 PM  Actions taken: Clinical Note Signed

## 2020-08-17 NOTE — Progress Notes (Signed)
Patient is being seen in the Atrial Fibrillation Clinic. VS, EKG, and device interrogations performed in the clinic. Clint Fenton, PA is at home and seeing patients via telemedicine as he is in quarantine.  

## 2020-08-17 NOTE — Progress Notes (Signed)
° ° °Primary Care Physician: Kruppenbach, Katherine H, FNP °Primary Cardiologist: Dr Tobb °Primary Electrophysiologist: Dr Lambert °Referring Physician: Dr Lambert ° ° °Christy Gomez is a 80 y.o. female with a history of HLD, severe tremors, and atrial fibrillation who presents for follow up in the [facility]. Patient was referred to Dr Lambert from Dr Tobb for Watchman evaluation. She was felt to be a poor candidate for long term anticoagulation due to her debilitating tremors and fall risk. Patient is on Xarelto for a CHADS2VASC score of 3. She is unaware of her arrhythmia.  ° °Today, she denies symptoms of palpitations, chest pain, shortness of breath, orthopnea, PND, lower extremity edema, dizziness, presyncope, syncope, snoring, daytime somnolence, bleeding, or neurologic sequela. The patient is tolerating medications without difficulties and is otherwise without complaint today.  ° ° °Atrial Fibrillation Risk Factors: ° °she does not have symptoms or diagnosis of sleep apnea. °she does not have a history of rheumatic fever. ° ° °she has a BMI of Body mass index is 20.82 kg/m².. °Filed Weights  ° 08/17/20 1453  °Weight: 50 kg  ° ° °Family History  °Problem Relation Age of Onset  °• Tremor Mother   °• Colon cancer Mother   °• Tremor Maternal Grandmother   °• Tremor Other   ° ° ° °Atrial Fibrillation Management history: ° °Previous antiarrhythmic drugs: none °Previous cardioversions: none °Previous ablations: none °CHADS2VASC score: 3 °Anticoagulation history: Xarelto ° ° °Past Medical History:  °Diagnosis Date  °• Atrial fibrillation (HCC)   °• Benign familial tremor   °• Cerebellar vertigo, unspecified laterality   °• Disabling essential tremor   °• Diverticulitis   °• Hiatal hernia   °• Mixed hyperlipidemia   °• Osteoporosis   ° °Past Surgical History:  °Procedure Laterality Date  °• CARPAL TUNNEL RELEASE Right 2014  °• CATARACT EXTRACTION W/ INTRAOCULAR LENS  IMPLANT, BILATERAL   2012  ° ° °Current Outpatient Medications  °Medication Sig Dispense Refill  °• acetaminophen (TYLENOL) 325 MG tablet Take 325 mg by mouth every 6 (six) hours as needed for moderate pain.    °• clonazePAM (KLONOPIN) 1 MG tablet Take 0.5 mg by mouth daily. Take 0.5 - 1 tablet by mouth 2 times daily as needed for anxiety (tremor)    °• fluticasone (FLONASE) 50 MCG/ACT nasal spray Place 1 spray into both nostrils daily as needed for allergies.    °• rivaroxaban (XARELTO) 20 MG TABS tablet Take 1 tablet (20 mg total) by mouth daily with supper. 30 tablet 12  ° °No current facility-administered medications for this encounter.  ° ° °Allergies  °Allergen Reactions  °• Diazepam Other (See Comments)  °  Extremely hyperactivity  °• Levofloxacin Other (See Comments)  °  Other reaction(s): Arthralgias (intolerance)  °• Propranolol Other (See Comments)  °  Pt reports extreme weakness  °• Ciprofloxacin Other (See Comments)  °  Felt like she had tendonitis in arms   °• Gabapentin Other (See Comments)  °  Pt doesn't remember but can't take.  °• Metronidazole Other (See Comments)  °  Unknown   °• Other Other (See Comments) and Rash  °  Bone and joint pain and chest pain  °• Oxycodone-Acetaminophen Other (See Comments)  °  Mood swings  °• Primidone Other (See Comments)  °  Pt doesn't remember but can't take  °• Sertraline Other (See Comments)  °  Pt reports that it disturbs her memory.   °• Statins Other (See Comments)  °    Bone and joint pain and chest pain  °• Topiramate Other (See Comments)  °  Pt reports painful and frequent urination.   °• Hydrocodone   °  Other reaction(s): Other (See Comments)  °• Colesevelam Other (See Comments)  ° ° °Social History  ° °Socioeconomic History  °• Marital status: Married  °  Spouse name: Not on file  °• Number of children: Not on file  °• Years of education: Not on file  °• Highest education level: Not on file  °Occupational History  °• Not on file  °Tobacco Use  °• Smoking status: Never Smoker   °• Smokeless tobacco: Never Used  °Substance and Sexual Activity  °• Alcohol use: Never  °• Drug use: Never  °• Sexual activity: Not on file  °Other Topics Concern  °• Not on file  °Social History Narrative  °• Not on file  ° °Social Determinants of Health  ° °Financial Resource Strain: Not on file  °Food Insecurity: Not on file  °Transportation Needs: Not on file  °Physical Activity: Not on file  °Stress: Not on file  °Social Connections: Not on file  °Intimate Partner Violence: Not on file  ° ° ° °ROS- All systems are reviewed and negative except as per the HPI above. ° °Physical Exam: °Vitals:  ° 08/17/20 1453  °BP: 124/80  °Pulse: 67  °Weight: 50 kg  °Height: 5' 1" (1.549 m)  ° °Exam: °Well appearing, alert and conversant, regular work of breathing,  good skin color °Eyes- anicteric, neuro- grossly intact, skin- no apparent rash or lesions or cyanosis, mouth- oral mucosa is pink ° ° °Wt Readings from Last 3 Encounters:  °08/17/20 50 kg  °07/06/20 48.5 kg  °06/14/20 50.3 kg  ° ° °EKG today demonstrates  °Afib, RBBB °Vent. rate 67 BPM °QRS duration 116 ms °QT/QTc 408/431 ms ° °Echo 02/13/20 demonstrated  °1. Left ventricular ejection fraction, by estimation, is 60 to 65%. The  °left ventricle has normal function. The left ventricle has no regional  °wall motion abnormalities. Left ventricular diastolic parameters are  °indeterminate.  ° 2. Right ventricular systolic function is normal. The right ventricular  °size is normal. There is normal pulmonary artery systolic pressure.  ° 3. The mitral valve is normal in structure. Trivial mitral valve  °regurgitation. No evidence of mitral stenosis.  ° 4. Tricuspid valve regurgitation is mild to moderate.  ° 5. The aortic valve is normal in structure. Aortic valve regurgitation is  °trivial. No aortic stenosis is present.  ° 6. The inferior vena cava is normal in size with greater than 50%  °respiratory variability, suggesting right atrial pressure of 3 mmHg.  ° ° °Epic  records are reviewed at length today ° °CHA2DS2-VASc Score = 3  °The patient's score is based upon: °CHF History: No °HTN History: No °Diabetes History: No °Stroke History: No °Vascular Disease History: No °Age Score: 2 °Gender Score: 1 °   ° ° °ASSESSMENT AND PLAN: °1. Persistent Atrial Fibrillation (ICD10:  I48.0) °The patient's CHA2DS2-VASc score is 3, indicating a 3.2% annual risk of stroke.   °I have seen Alizzon M Coby is a 80 y.o. female in the office today who has been referred for a Watchman left atrial appendage closure device.  She has a history of paroxysmal atrial fibrillation.  This patient's CHA2DS2-VASc Score and unadjusted Ischemic Stroke Rate (% per year) is equal to 3.2 % stroke rate/year from a score of 3 which necessitates long term oral anticoagulation to prevent stroke. Unfortunately, She is not felt   to be a long term anticoagulation candidate secondary to her fall risk with significant tremors.  Patient felt to be a candidate for short term oral anticoagulation.  Procedural risks for the Watchman implant were again reviewed with the patient including a 0.5% risk of stroke, <1% risk of perforation, <1% risk of device embolization. She is scheduled for Watchman implant 08/26/20. ° °Cardiac CT 07/22/20 showed Left atrial appendage landing zone measures 28 mm x 22 mm in diameter with area 4.93 cm^2 and perimeter 80 mm. Maximum length measures 23mm. Measurements are appropriate for a 35mm Watchman FLX device (maximum diameter 28mm, 20% compression) ° °Check bmet/CBC today. °Check CXR °Continue Xarelto 20 mg daily. °Will start ASA 81 mg daily on the day of procedure.  ° °2. Secondary Hypercoagulable State (ICD10:  D68.69) °The patient is at significant risk for stroke/thromboembolism based upon her CHA2DS2-VASc Score of 3.  Continue Rivaroxaban (Xarelto).  ° ° °Follow up for Watchman implant 08/27/20. ° ° °Ricky Rasheeda Mulvehill PA-C °[facility] °[facility] °[address] °, [place]  [address] °[phone] °08/17/2020 °3:07 PM ° °

## 2020-08-24 ENCOUNTER — Other Ambulatory Visit (HOSPITAL_COMMUNITY)
Admission: RE | Admit: 2020-08-24 | Discharge: 2020-08-24 | Disposition: A | Discharge status: Home / Self Care | Payer: Medicare HMO | Source: Ambulatory Visit | Attending: Cardiology | Admitting: Cardiology

## 2020-08-24 ENCOUNTER — Other Ambulatory Visit (HOSPITAL_COMMUNITY): Payer: Self-pay | Admitting: Nurse Practitioner

## 2020-08-24 DIAGNOSIS — U071 COVID-19: Secondary | ICD-10-CM | POA: Diagnosis not present

## 2020-08-24 DIAGNOSIS — Z01812 Encounter for preprocedural laboratory examination: Secondary | ICD-10-CM | POA: Insufficient documentation

## 2020-08-24 LAB — SARS CORONAVIRUS 2 (TAT 6-24 HRS): SARS Coronavirus 2: POSITIVE — AB

## 2020-08-25 ENCOUNTER — Telehealth: Payer: Self-pay

## 2020-08-25 ENCOUNTER — Telehealth: Payer: Self-pay | Admitting: Nurse Practitioner

## 2020-08-25 NOTE — Telephone Encounter (Signed)
Called to discuss with patient about COVID-19 symptoms and the use of one of the available treatments for those with mild to moderate Covid symptoms and at a high risk of hospitalization.  Pt appears to qualify for outpatient treatment due to co-morbid conditions and/or a member of an at-risk group in accordance with the FDA Emergency Use Authorization.    Symptom onset: None Vaccinated: No Booster? No Immunocompromised? No Qualifiers: Yes  Pt. Reports she has no symptoms. No known exposure.  Christy Gomez

## 2020-08-25 NOTE — Telephone Encounter (Signed)
Christy Gomez was contacted this morning and advised that her Watchman procedure was canceled due to a Positive COVID test. She reported that she is asymptomatic and I instructed her to inform all close contacts to get tested and to quarantine and retest per CDC guidelines. We will touch base with her early next week.  Robin Searing, RN Watchman Coordinator [phone]

## 2020-08-26 ENCOUNTER — Inpatient Hospital Stay (HOSPITAL_COMMUNITY): Admission: RE | Admit: 2020-08-26 | Payer: Medicare HMO | Source: Home / Self Care | Admitting: Cardiology

## 2020-08-26 ENCOUNTER — Encounter (HOSPITAL_COMMUNITY): Admission: RE | Payer: Self-pay | Source: Home / Self Care

## 2020-08-26 SURGERY — LEFT ATRIAL APPENDAGE OCCLUSION
Anesthesia: General

## 2020-08-27 ENCOUNTER — Telehealth: Payer: Self-pay | Admitting: Nurse Practitioner

## 2020-08-27 NOTE — Telephone Encounter (Signed)
Patient contacted to follow up on recent canceled procedure due to positive COVID test. We discussed her new procedure date of 2/17 and all questions were answers and she was thankful for the call today. I advised to call me with any procedure related question and provided my contact information.   Robin Searing NP Watchman Coordinator

## 2020-09-07 ENCOUNTER — Telehealth: Payer: Self-pay | Admitting: Nurse Practitioner

## 2020-09-07 NOTE — Telephone Encounter (Signed)
Call received this morning regarding Christy Gomez procedure time. Patient provided arrival time of 11:30 for 13:30 start and advise to give me a call for any other concerns. They agreed and were thankful for the call.  Robin Searing, RN Watchman Coordinator

## 2020-09-08 ENCOUNTER — Inpatient Hospital Stay (HOSPITAL_COMMUNITY): Payer: Medicare HMO | Admitting: Physician Assistant

## 2020-09-08 NOTE — Telephone Encounter (Signed)
No action needed

## 2020-09-09 ENCOUNTER — Inpatient Hospital Stay (HOSPITAL_COMMUNITY): Admit: 2020-09-09 | Payer: Medicare HMO | Admitting: Cardiology

## 2020-09-09 ENCOUNTER — Encounter (HOSPITAL_COMMUNITY): Payer: Self-pay | Admitting: Cardiology

## 2020-09-09 ENCOUNTER — Inpatient Hospital Stay (HOSPITAL_COMMUNITY): Payer: Medicare HMO

## 2020-09-09 ENCOUNTER — Other Ambulatory Visit: Payer: Self-pay

## 2020-09-09 ENCOUNTER — Ambulatory Visit (HOSPITAL_COMMUNITY)
Admission: RE | Admit: 2020-09-09 | Discharge: 2020-09-09 | Disposition: A | Discharge status: Home / Self Care | Payer: Medicare HMO | Attending: Cardiology | Admitting: Cardiology

## 2020-09-09 ENCOUNTER — Encounter (HOSPITAL_COMMUNITY)
Admission: RE | Disposition: A | Discharge status: Home / Self Care | Payer: Self-pay | Source: Home / Self Care | Attending: Cardiology

## 2020-09-09 DIAGNOSIS — Z79899 Other long term (current) drug therapy: Secondary | ICD-10-CM | POA: Diagnosis not present

## 2020-09-09 DIAGNOSIS — I4819 Other persistent atrial fibrillation: Secondary | ICD-10-CM | POA: Diagnosis present

## 2020-09-09 DIAGNOSIS — Z881 Allergy status to other antibiotic agents status: Secondary | ICD-10-CM | POA: Diagnosis not present

## 2020-09-09 DIAGNOSIS — D6869 Other thrombophilia: Secondary | ICD-10-CM | POA: Diagnosis not present

## 2020-09-09 DIAGNOSIS — Z7901 Long term (current) use of anticoagulants: Secondary | ICD-10-CM | POA: Insufficient documentation

## 2020-09-09 HISTORY — DX: Cardiac arrhythmia, unspecified: I49.9

## 2020-09-09 LAB — ABO/RH
ABO/RH(D): A NEG
Weak D: POSITIVE

## 2020-09-09 LAB — TYPE AND SCREEN
ABO/RH(D): A NEG
Antibody Screen: NEGATIVE
Weak D: POSITIVE

## 2020-09-09 LAB — SURGICAL PCR SCREEN
MRSA, PCR: NEGATIVE
Staphylococcus aureus: POSITIVE — AB

## 2020-09-09 SURGERY — LEFT ATRIAL APPENDAGE OCCLUSION
Anesthesia: General

## 2020-09-09 MED ORDER — CHLORHEXIDINE GLUCONATE 0.12 % MT SOLN
15.0000 mL | Freq: Once | OROMUCOSAL | Status: AC
  Administered 2020-09-09: 15 mL via OROMUCOSAL
  Filled 2020-09-09: qty 15

## 2020-09-09 MED ORDER — SODIUM CHLORIDE 0.9 % IV SOLN: INTRAVENOUS | Status: DC

## 2020-09-09 MED ORDER — CEFAZOLIN SODIUM-DEXTROSE 2-4 GM/100ML-% IV SOLN
2.0000 g | INTRAVENOUS | Status: DC
  Filled 2020-09-09: qty 100

## 2020-09-09 NOTE — Interval H&P Note (Deleted)
History and Physical Interval Note:  09/09/2020 2:19 PM  Christy Gomez  has presented today for surgery, with the diagnosis of afib.  The various methods of treatment have been discussed with the patient and family. After consideration of risks, benefits and other options for treatment, the patient has consented to  Procedure(s): LEFT ATRIAL APPENDAGE OCCLUSION (N/A) TRANSESOPHAGEAL ECHOCARDIOGRAM (TEE) (N/A) as a surgical intervention.  The patient's history has been reviewed, patient examined, no change in status, stable for surgery.  I have reviewed the patient's chart and labs.  Questions were answered to the patient's satisfaction.     Samaa Ueda T Jediah Horger

## 2020-09-09 NOTE — Progress Notes (Signed)
I met with the patient in the prep area before planned Watchman implant. The patient tells me that she has lost a significant amount of weight in the preceding months (50lbs total weight loss). On my physical exam, she appears weaker than during my previous in-person encounter several months ago. Her tremors are very prominent. She has not yet met with neurology but tells me that her appointment is scheduled for later this month.  I am concerned about her weight loss and tremors and worry that there may be a progressive neurologic condition contributing to her symptoms. I do not think that proceeding with the Watchman implant today is the best plan. I would like to cancel today's procedure and plan to see her in clinic in the coming weeks after she has met with the neurologist.  I also discussed the case with the patient's husband in the waiting area and communicated my concerns. He is in agreement with the plan to cancel today's surgery and see back soon in the office.  Lysbeth Galas T. Quentin Ore, MD, [facility]rophys[facility]

## 2020-09-09 NOTE — Anesthesia Preprocedure Evaluation (Signed)
Anesthesia Evaluation  Patient identified by MRN, date of birth, ID band Patient awake    Reviewed: Allergy & Precautions, NPO status , Patient's Chart, lab work & pertinent test results  Airway Mallampati: II  TM Distance: >3 FB Neck ROM: Full    Dental  (+) Teeth Intact, Dental Advisory Given   Pulmonary neg pulmonary ROS,    Pulmonary exam normal breath sounds clear to auscultation       Cardiovascular + dysrhythmias Atrial Fibrillation  Rhythm:Irregular Rate:Abnormal  Echo 02/13/20: IMPRESSIONS    1. Left ventricular ejection fraction, by estimation, is 60 to 65%. The  left ventricle has normal function. The left ventricle has no regional  wall motion abnormalities. Left ventricular diastolic parameters are  indeterminate.  2. Right ventricular systolic function is normal. The right ventricular  size is normal. There is normal pulmonary artery systolic pressure.  3. The mitral valve is normal in structure. Trivial mitral valve  regurgitation. No evidence of mitral stenosis.  4. Tricuspid valve regurgitation is mild to moderate.  5. The aortic valve is normal in structure. Aortic valve regurgitation is  trivial. No aortic stenosis is present.  6. The inferior vena cava is normal in size with greater than 50%  respiratory variability, suggesting right atrial pressure of 3 mmHg.    Neuro/Psych  Headaches, PSYCHIATRIC DISORDERS Depression Essential tremor     GI/Hepatic Neg liver ROS, hiatal hernia,   Endo/Other  negative endocrine ROS  Renal/GU negative Renal ROS     Musculoskeletal negative musculoskeletal ROS (+)   Abdominal   Peds  Hematology  (+) Blood dyscrasia (Xarelto), ,   Anesthesia Other Findings Day of surgery medications reviewed with the patient.  Reproductive/Obstetrics                             Anesthesia Physical Anesthesia Plan  ASA: III  Anesthesia  Plan: General   Post-op Pain Management:    Induction: Intravenous  PONV Risk Score and Plan: 3 and Dexamethasone, Ondansetron and Treatment may vary due to age or medical condition  Airway Management Planned: Oral ETT  Additional Equipment: Arterial line  Intra-op Plan:   Post-operative Plan: Extubation in OR  Informed Consent: I have reviewed the patients History and Physical, chart, labs and discussed the procedure including the risks, benefits and alternatives for the proposed anesthesia with the patient or authorized representative who has indicated his/her understanding and acceptance.     Dental advisory given  Plan Discussed with: CRNA  Anesthesia Plan Comments:         Anesthesia Quick Evaluation

## 2020-09-09 NOTE — Anesthesia Procedure Notes (Signed)
Arterial Line Insertion Start/End2/17/2022 1:20 PM, 09/09/2020 1:30 PM Performed by: Shireen Quan, CRNA, CRNA  Patient location: Pre-op. Preanesthetic checklist: patient identified, IV checked, site marked, risks and benefits discussed, surgical consent, monitors and equipment checked, pre-op evaluation, timeout performed and anesthesia consent Lidocaine 1% used for infiltration Right, radial was placed Catheter size: 20 G Hand hygiene performed , maximum sterile barriers used  and Seldinger technique used  Attempts: 1 Procedure performed without using ultrasound guided technique. Following insertion, dressing applied and Biopatch. Post procedure assessment: normal  Patient tolerated the procedure well with no immediate complications.

## 2020-09-16 ENCOUNTER — Encounter: Payer: Self-pay | Admitting: Neurology

## 2020-09-16 ENCOUNTER — Ambulatory Visit: Payer: Medicare HMO | Admitting: Neurology

## 2020-09-16 VITALS — BP 121/70 | HR 66 | Ht 61.0 in | Wt 111.2 lb

## 2020-09-16 DIAGNOSIS — G250 Essential tremor: Secondary | ICD-10-CM | POA: Diagnosis not present

## 2020-09-16 MED ORDER — PROPRANOLOL HCL ER 80 MG PO CP24
80.0000 mg | ORAL_CAPSULE | Freq: Every day | ORAL | 6 refills | Status: DC
Start: 2020-09-16 — End: 2020-12-07

## 2020-09-16 NOTE — Progress Notes (Signed)
Chief Complaint  Patient presents with  . New Patient (Initial Visit)    New room with husband. Internal referral from Steffanie Dunn, MD for tremors. Has all over body tremors. Started getting worse about 6 months. Husband has to feed her.     HISTORICAL  Christy Gomez is a 80 year old female, seen in request by her primary care physician Dr. Steffanie Dunn for evaluation of tremor, she is accompanied by her husband at today's visit September 16, 2020  I reviewed and summarized the referring note.  Past medical history Atrial fibrillation, taking Xarelto 20 mg daily  She has a strong family history of essential tremor, mother has it, her son in her 14s has it.  Patient noticed gradual onset bilateral hands tremor since her 34s, gradually getting worse, also develop head titubation,  She has such a large amplitude tremor, to the point of affecting her daily activity, such as eating, she now needs her husband to feed her  Over the years, she tried different medications, could not tolerate Inderal in the past due to extreme fatigue, primidone, for unknown side effect listed as medication allergy, total 14 reported allergic medications,  Also reviewed [facility] movement special list Dr. Lieutenant Diego evaluation in 2013, "she would be an excellent candidate for VIM deep brain stimulation, either unilateral focusing on her dominant hand versus bilateral as she does have a good deal of midline involvement in her voice and head. She will need a neuropsychological evaluation to identify her degree of cognitive risk from the surgery. Since she also has a great deal of anxiety, we would like her to see our neuropsychiatrist prior to further surgical discussion. She will also need to meet with the neurosurgeon. She was provided with more reading material as well as a DVD with information about deep brain stimulation."  This is in contradiction of patient's today's report she was told not to be a deep  brain stimulator candidate, somehow she lost to follow-up never follow through,  She is now taking clonazepam 0.5 mg every morning, higher dose would cause too much side effect  REVIEW OF SYSTEMS: Full 14 system review of systems performed and notable only for as above All other review of systems were negative.  ALLERGIES: Allergies  Allergen Reactions  . Diazepam Other (See Comments)    Extremely hyperactivity  . Levofloxacin Other (See Comments)    Other reaction(s): Arthralgias (intolerance)  . Propranolol Other (See Comments)    Pt reports extreme weakness  . Ciprofloxacin Other (See Comments)    Felt like she had tendonitis in arms   . Gabapentin Other (See Comments)    Pt doesn't remember but can't take.  . Metronidazole Other (See Comments)    Unknown   . Other Other (See Comments) and Rash    Bone and joint pain and chest pain  . Oxycodone-Acetaminophen Other (See Comments)    Mood swings  . Primidone Other (See Comments)    Pt doesn't remember but can't take  . Sertraline Other (See Comments)    Pt reports that it disturbs her memory.   . Statins Other (See Comments)    Bone and joint pain and chest pain  . Topiramate Other (See Comments)    Pt reports painful and frequent urination.   . Hydrocodone     Other reaction(s): Other (See Comments)  . Colesevelam Other (See Comments)    HOME MEDICATIONS: Current Outpatient Medications  Medication Sig Dispense Refill  . acetaminophen (TYLENOL) 325  MG tablet Take 325 mg by mouth every 6 (six) hours as needed for moderate pain.    . cholecalciferol (VITAMIN D3) 25 MCG (1000 UNIT) tablet Take 1,000 Units by mouth daily.    . clonazePAM (KLONOPIN) 1 MG tablet Take 0.5 mg by mouth daily. Take 0.5 - 1 tablet by mouth 2 times daily as needed for anxiety (tremor)    . fluticasone (FLONASE) 50 MCG/ACT nasal spray Place 1 spray into both nostrils daily as needed for allergies.    . rivaroxaban (XARELTO) 20 MG TABS tablet Take 1  tablet (20 mg total) by mouth daily with supper. 30 tablet 12   No current facility-administered medications for this visit.    PAST MEDICAL HISTORY: Past Medical History:  Diagnosis Date  . Atrial fibrillation (HCC)   . Benign familial tremor   . Cerebellar vertigo, unspecified laterality   . Disabling essential tremor   . Diverticulitis   . Dysrhythmia    afib  . Hiatal hernia   . Mixed hyperlipidemia   . Osteoporosis     PAST SURGICAL HISTORY: Past Surgical History:  Procedure Laterality Date  . CARPAL TUNNEL RELEASE Right 2014  . CATARACT EXTRACTION W/ INTRAOCULAR LENS  IMPLANT, BILATERAL  2012    FAMILY HISTORY: Family History  Problem Relation Age of Onset  . Tremor Mother   . Colon cancer Mother   . Tremor Maternal Grandmother   . Tremor Other     SOCIAL HISTORY: Social History   Socioeconomic History  . Marital status: Married    Spouse name: Not on file  . Number of children: Not on file  . Years of education: Not on file  . Highest education level: Not on file  Occupational History  . Not on file  Tobacco Use  . Smoking status: Never Smoker  . Smokeless tobacco: Never Used  Substance and Sexual Activity  . Alcohol use: Never  . Drug use: Never  . Sexual activity: Not on file  Other Topics Concern  . Not on file  Social History Narrative  . Not on file   Social Determinants of Health   Financial Resource Strain: Not on file  Food Insecurity: Not on file  Transportation Needs: Not on file  Physical Activity: Not on file  Stress: Not on file  Social Connections: Not on file  Intimate Partner Violence: Not on file     PHYSICAL EXAM   Vitals:   09/16/20 1309  BP: 121/70  Pulse: 66  Weight: 111 lb 3.2 oz (50.4 kg)  Height: 5\' 1"  (1.549 m)   Not recorded     Body mass index is 21.01 kg/m.  PHYSICAL EXAMNIATION:  Gen: NAD, conversant, well nourised, well groomed                     Cardiovascular: Regular rate rhythm, no  peripheral edema, warm, nontender. Eyes: Conjunctivae clear without exudates or hemorrhage Neck: Supple, no carotid bruits. Pulmonary: Clear to auscultation bilaterally   NEUROLOGICAL EXAM:  MENTAL STATUS: Speech:    Speech is normal; fluent and spontaneous with normal comprehension.  Cognition:     Orientation to time, place and person     Normal recent and remote memory     Normal Attention span and concentration     Normal Language, naming, repeating,spontaneous speech     Fund of knowledge   CRANIAL NERVES: CN II: Visual fields are full to confrontation. Pupils are round equal and briskly reactive to  light. CN III, IV, VI: extraocular movement are normal. No ptosis. CN V: Facial sensation is intact to light touch CN VII: Face is symmetric with normal eye closure  CN VIII: Hearing is normal to causal conversation. CN IX, X: Phonation is normal. CN XI: Head turning and shoulder shrug are intact  MOTOR: Constant large amplitude bilateral upper extremity postural and tremor, increased by movement, no rigidity, bradykinesia, no muscle weakness, also almost constant had no no and rotatory titubation, mild voice change  REFLEXES: Reflexes are 2+ and symmetric at the biceps, triceps, knees, and ankles. Plantar responses are flexor.  SENSORY: Intact to light touch, pinprick and vibratory sensation are intact in fingers and toes.  COORDINATION: There is no trunk or limb dysmetria noted.  GAIT/STANCE: Need push-up to get up from seated position, wide-based unsteady   DIAGNOSTIC DATA (LABS, IMAGING, TESTING) - I reviewed patient records, labs, notes, testing and imaging myself where available.   ASSESSMENT AND PLAN  Christy Gomez is a 80 y.o. female   Essential tremor  Significant large amplitude tremor, affecting daily activity, was deemed to a deep brain stimulator candidate in the past,  Keep current dose of clonazepam 0.5 mg every day  Add on low-dose Inderal LA 80  mg every day for symptomatic control  Refer her to [facility] movement specialist for potential deep brain stimulator   Levert Feinstein, M.D. Ph.D.  [facility] [address], [address] [place], [place] [address] Ph: [phone] Fax: [phone]  CC:  Lanier Prude, MD [address] [place],  [place] [address]  Laurel Dimmer, FNP

## 2020-11-29 DIAGNOSIS — Z0000 Encounter for general adult medical examination without abnormal findings: Secondary | ICD-10-CM | POA: Insufficient documentation

## 2020-11-29 DIAGNOSIS — H6121 Impacted cerumen, right ear: Secondary | ICD-10-CM | POA: Insufficient documentation

## 2020-12-06 NOTE — Progress Notes (Signed)
Electrophysiology Office Follow up Visit Note:    Date:  12/06/2020   ID:  Christy Gomez, DOB 1941/03/03, MRN [id number]  PCP:  Marisue Humble, FNP  [facility] Cardiologist:  Berniece Salines, DO  [facility] Electrophysiologist:  Vickie Epley, MD    Interval History:    Christy Gomez is a 80 y.o. female who presents for a follow up visit.  I first met her during a work-up for possible watchman implant.  She was actually scheduled to have the procedure done but when she presented to the hospital for the procedure on September 09, 2020 she was noted to have a worsened tremor throughout her extremities and head.  It was so severe that her husband was having to feed her.  I decided to cancel the case and refer her to neurology for further evaluation and treatment of her tremor.  She was able to see Dr. Krista Blue with neurology on September 16, 2020.  They referred her to [facility] movement specialists for potential deep brain stimulation.  The patient is here today with her husband who have previously met.  She has not yet heard from the [facility] movement disorders clinic.     Past Medical History:  Diagnosis Date  . Atrial fibrillation ([place])   . Benign familial tremor   . Cerebellar vertigo, unspecified laterality   . Disabling essential tremor   . Diverticulitis   . Dysrhythmia    afib  . Hiatal hernia   . Mixed hyperlipidemia   . Osteoporosis     Past Surgical History:  Procedure Laterality Date  . CARPAL TUNNEL RELEASE Right 2014  . CATARACT EXTRACTION W/ INTRAOCULAR LENS  IMPLANT, BILATERAL  2012    Current Medications: No outpatient medications have been marked as taking for the 12/07/20 encounter (Appointment) with Vickie Epley, MD.     Allergies:   Diazepam, Levofloxacin, Propranolol, Ciprofloxacin, Gabapentin, Metronidazole, Other, Oxycodone-acetaminophen, Primidone, Sertraline, Statins, Topiramate, Hydrocodone, and Colesevelam   Social History    Socioeconomic History  . Marital status: Married    Spouse name: Not on file  . Number of children: Not on file  . Years of education: Not on file  . Highest education level: Not on file  Occupational History  . Not on file  Tobacco Use  . Smoking status: Never Smoker  . Smokeless tobacco: Never Used  Substance and Sexual Activity  . Alcohol use: Never  . Drug use: Never  . Sexual activity: Not on file  Other Topics Concern  . Not on file  Social History Narrative  . Not on file   Social Determinants of Health   Financial Resource Strain: Not on file  Food Insecurity: Not on file  Transportation Needs: Not on file  Physical Activity: Not on file  Stress: Not on file  Social Connections: Not on file     Family History: The patient's family history includes Colon cancer in her mother; Tremor in her maternal grandmother, mother, and another family member.  ROS:   Please see the history of present illness.    All other systems reviewed and are negative.  EKGs/Labs/Other Studies Reviewed:    The following studies were reviewed today: Neurology notes  EKG:  The ekg ordered today demonstrates atrial fibrillation, right bundle branch block  Recent Labs: 08/17/2020: BUN 13; Creatinine, Ser 0.82; Hemoglobin 11.9; Platelets 166; Potassium 3.9; Sodium 133  Recent Lipid Panel No results found for: CHOL, TRIG, HDL, CHOLHDL, VLDL, LDLCALC, LDLDIRECT  Physical Exam:    VS:  There were no vitals taken for this visit.    Wt Readings from Last 3 Encounters:  09/16/20 111 lb 3.2 oz (50.4 kg)  09/09/20 110 lb (49.9 kg)  08/17/20 110 lb 3.2 oz (50 kg)     GEN:  Well nourished, well developed in no acute distress HEENT: Normal NECK: No JVD; No carotid bruits LYMPHATICS: No lymphadenopathy CARDIAC: Irregularly irregular, no murmurs, rubs, gallops RESPIRATORY:  Clear to auscultation without rales, wheezing or rhonchi  ABDOMEN: Soft, non-tender,  non-distended MUSCULOSKELETAL:  No edema; No deformity  SKIN: Warm and dry NEUROLOGIC:  Alert and oriented x 3.  Large amplitude tremor in all 4 extremities, head and neck. PSYCHIATRIC:  Normal affect   ASSESSMENT:    1. Persistent atrial fibrillation ([place])   2. Tremor    PLAN:    In order of problems listed above:  1. Persistent/permanent atrial fibrillation On Xarelto for stroke prophylaxis Rates are controlled At this point, I do not think she is a candidate for the watchman procedure because of her other medical issues.  I believe that addressing her tremors will have the largest impact on her quality of life.  I have communicated that to the patient and her husband during today's appointment.   2.  Tremor. Has seen [facility] neurology who referred to [facility] movement disorders clinic.  I have provided them with the phone number for the movement disorders clinic coordinator to follow-up on the referral.     Medication Adjustments/Labs and Tests Ordered: Current medicines are reviewed at length with the patient today.  Concerns regarding medicines are outlined above.  No orders of the defined types were placed in this encounter.  No orders of the defined types were placed in this encounter.    Signed, Lars Mage, MD, [facility], [facility] 12/06/2020 6:34 PM    Electrophysiology [facility]

## 2020-12-07 ENCOUNTER — Ambulatory Visit: Payer: Medicare HMO | Admitting: Cardiology

## 2020-12-07 ENCOUNTER — Other Ambulatory Visit: Payer: Self-pay

## 2020-12-07 ENCOUNTER — Encounter: Payer: Self-pay | Admitting: Cardiology

## 2020-12-07 VITALS — BP 146/80 | HR 63 | Ht 61.0 in | Wt 114.0 lb

## 2020-12-07 DIAGNOSIS — R251 Tremor, unspecified: Secondary | ICD-10-CM

## 2020-12-07 DIAGNOSIS — I4819 Other persistent atrial fibrillation: Secondary | ICD-10-CM

## 2020-12-07 NOTE — Patient Instructions (Signed)
Medication Instructions:  Your physician recommends that you continue on your current medications as directed. Please refer to the Current Medication list given to you today.  Labwork: None ordered.  Testing/Procedures: None ordered.  Follow-Up: Your physician wants you to follow-up in: As needed with Steffanie Dunn, MD    Any Other Special Instructions Will Be Listed Below (If Applicable).  If you need a refill on your cardiac medications before your next appointment, please call your pharmacy.   Movement Clinic phone number: [phone]

## 2020-12-09 DIAGNOSIS — I4891 Unspecified atrial fibrillation: Secondary | ICD-10-CM | POA: Insufficient documentation

## 2020-12-09 DIAGNOSIS — K5792 Diverticulitis of intestine, part unspecified, without perforation or abscess without bleeding: Secondary | ICD-10-CM | POA: Insufficient documentation

## 2020-12-09 DIAGNOSIS — G250 Essential tremor: Secondary | ICD-10-CM | POA: Insufficient documentation

## 2020-12-09 DIAGNOSIS — I499 Cardiac arrhythmia, unspecified: Secondary | ICD-10-CM | POA: Insufficient documentation

## 2020-12-13 ENCOUNTER — Ambulatory Visit: Payer: Medicare HMO | Admitting: Cardiology

## 2020-12-13 ENCOUNTER — Encounter: Payer: Self-pay | Admitting: Cardiology

## 2020-12-13 ENCOUNTER — Other Ambulatory Visit: Payer: Self-pay

## 2020-12-13 VITALS — BP 120/68 | HR 60 | Ht 61.0 in | Wt 115.4 lb

## 2020-12-13 DIAGNOSIS — Z79899 Other long term (current) drug therapy: Secondary | ICD-10-CM | POA: Diagnosis not present

## 2020-12-13 DIAGNOSIS — I480 Paroxysmal atrial fibrillation: Secondary | ICD-10-CM | POA: Diagnosis not present

## 2020-12-13 DIAGNOSIS — E782 Mixed hyperlipidemia: Secondary | ICD-10-CM

## 2020-12-13 DIAGNOSIS — I10 Essential (primary) hypertension: Secondary | ICD-10-CM | POA: Diagnosis not present

## 2020-12-13 NOTE — Patient Instructions (Signed)

## 2020-12-13 NOTE — Progress Notes (Signed)
Cardiology Office Note:    Date:  12/14/2020   ID:  Christy Gomez, DOB [birth year], MRN [id number]  PCP:  Hadley Pen, MD  Cardiologist:  Thomasene Ripple, DO  Electrophysiologist:  Lanier Prude, MD   Referring MD: Selena Batten *   " Still the same"  History of Present Illness:    Christy Gomez is a 80 y.o. female with a hx of paroxysmal atrial fibrillation on Xarelto, essential tremors, hyperlipidemia presents today for follow-up visit.   I did see the patient on 02/02/2020 at that time she was on Xarelto and had recently been diagnosed with atrial fibrillation. I continued patient on Xarelto, additionally I recommended consideration for watchman given her history of essential tremors but the patient did not want to discuss this further at that time.  Since I last saw the patient June 14, 2020 I referred her to my partner Dr. Lalla Brothers for evaluation for watchman device.  She was initially scheduled but for the watchman but this was canceled based on further discussion that the patient needs to have multiple treatment of her essential tremor.  She was seen at [facility] neurology and was referred to [facility] movement disorder clinic.  She notes that she has been told that she may need a brain stimulator but she is waiting to make decisions on this.  She is not ready yet.  Is in office with her husband today.   Past Medical History:  Diagnosis Date  . Atrial fibrillation (HCC)   . Benign familial tremor   . Cerebellar vertigo, unspecified laterality   . Disabling essential tremor   . Diverticulitis   . Dysrhythmia    afib  . Hiatal hernia   . Mixed hyperlipidemia   . Osteoporosis     Past Surgical History:  Procedure Laterality Date  . CARPAL TUNNEL RELEASE Right 2014  . CATARACT EXTRACTION W/ INTRAOCULAR LENS  IMPLANT, BILATERAL  2012    Current Medications: Current Meds  Medication Sig  . acetaminophen (TYLENOL) 325 MG tablet Take 325 mg by mouth  every 6 (six) hours as needed for moderate pain.  . cholecalciferol (VITAMIN D3) 25 MCG (1000 UNIT) tablet Take 1,000 Units by mouth daily.  . clonazePAM (KLONOPIN) 1 MG tablet Take 0.5 mg by mouth daily. Take 0.5 - 1 tablet by mouth 2 times daily as needed for anxiety (tremor)  . fluticasone (FLONASE) 50 MCG/ACT nasal spray Place 1 spray into both nostrils daily as needed for allergies.  . rivaroxaban (XARELTO) 20 MG TABS tablet Take 1 tablet (20 mg total) by mouth daily with supper.     Allergies:   Diazepam, Levofloxacin, Propranolol, Ciprofloxacin, Gabapentin, Metronidazole, Other, Oxycodone-acetaminophen, Primidone, Sertraline, Statins, Topiramate, Hydrocodone, and Colesevelam   Social History   Socioeconomic History  . Marital status: Married    Spouse name: Not on file  . Number of children: Not on file  . Years of education: Not on file  . Highest education level: Not on file  Occupational History  . Not on file  Tobacco Use  . Smoking status: Never Smoker  . Smokeless tobacco: Never Used  Substance and Sexual Activity  . Alcohol use: Never  . Drug use: Never  . Sexual activity: Not on file  Other Topics Concern  . Not on file  Social History Narrative  . Not on file   Social Determinants of Health   Financial Resource Strain: Not on file  Food Insecurity: Not on file  Transportation Needs:  Not on file  Physical Activity: Not on file  Stress: Not on file  Social Connections: Not on file     Family History: The patient's family history includes Colon cancer in her mother; Tremor in her maternal grandmother, mother, and another family member.  ROS:   Review of Systems  Constitution: Negative for decreased appetite, fever and weight gain.  HENT: Negative for congestion, ear discharge, hoarse voice and sore throat.   Eyes: Negative for discharge, redness, vision loss in right eye and visual halos.  Cardiovascular: Negative for chest pain, dyspnea on exertion, leg  swelling, orthopnea and palpitations.  Respiratory: Negative for cough, hemoptysis, shortness of breath and snoring.   Endocrine: Negative for heat intolerance and polyphagia.  Hematologic/Lymphatic: Negative for bleeding problem. Does not bruise/bleed easily.  Skin: Negative for flushing, nail changes, rash and suspicious lesions.  Musculoskeletal: Negative for arthritis, joint pain, muscle cramps, myalgias, neck pain and stiffness.  Gastrointestinal: Negative for abdominal pain, bowel incontinence, diarrhea and excessive appetite.  Genitourinary: Negative for decreased libido, genital sores and incomplete emptying.  Neurological: Negative for brief paralysis, focal weakness, headaches and loss of balance.  Psychiatric/Behavioral: Negative for altered mental status, depression and suicidal ideas.  Allergic/Immunologic: Negative for HIV exposure and persistent infections.    EKGs/Labs/Other Studies Reviewed:    The following studies were reviewed today:   EKG: None today  Recent Labs: 08/17/2020: BUN 13; Creatinine, Ser 0.82; Hemoglobin 11.9; Platelets 166; Potassium 3.9; Sodium 133  Recent Lipid Panel No results found for: CHOL, TRIG, HDL, CHOLHDL, VLDL, LDLCALC, LDLDIRECT  Physical Exam:    VS:  BP 120/68   Pulse 60   Ht 5\' 1"  (1.549 m)   Wt 115 lb 6.4 oz (52.3 kg)   SpO2 97%   BMI 21.80 kg/m     Wt Readings from Last 3 Encounters:  12/13/20 115 lb 6.4 oz (52.3 kg)  12/07/20 114 lb (51.7 kg)  09/16/20 111 lb 3.2 oz (50.4 kg)     GEN: Well nourished, well developed in no acute distress HEENT: Normal NECK: No JVD; No carotid bruits LYMPHATICS: No lymphadenopathy CARDIAC: S1S2 noted,RRR, no murmurs, rubs, gallops RESPIRATORY:  Clear to auscultation without rales, wheezing or rhonchi  ABDOMEN: Soft, non-tender, non-distended, +bowel sounds, no guarding. EXTREMITIES: No edema, No cyanosis, no clubbing MUSCULOSKELETAL:  No deformity  SKIN: Warm and dry NEUROLOGIC:  Alert  and oriented x 3, non-focal PSYCHIATRIC:  Normal affect, good insight  ASSESSMENT:    1. PAF (paroxysmal atrial fibrillation) (HCC)   2. Hypertension, unspecified type   3. Mixed hyperlipidemia   4. High risk medication use    PLAN:     1.  For now we will continue the patient on her Xarelto for her paroxysmal atrial fibrillation.  I still do believe she is a great candidate for watchman device.  But for right now she is following with the neurology for her treatment options.  Blood pressure is acceptable, continue with current antihypertensive regimen.  The patient is in agreement with the above plan. The patient left the office in stable condition.  The patient will follow up in   Medication Adjustments/Labs and Tests Ordered: Current medicines are reviewed at length with the patient today.  Concerns regarding medicines are outlined above.  No orders of the defined types were placed in this encounter.  No orders of the defined types were placed in this encounter.   Patient Instructions  Medication Instructions:  Your physician recommends that you continue on  your current medications as directed. Please refer to the Current Medication list given to you today.  *If you need a refill on your cardiac medications before your next appointment, please call your pharmacy*   Lab Work: None If you have labs (blood work) drawn today and your tests are completely normal, you will receive your results only by: Marland Kitchen. MyChart Message (if you have MyChart) OR . A paper copy in the mail If you have any lab test that is abnormal or we need to change your treatment, we will call you to review the results.   Testing/Procedures: None   Follow-Up: At [facility], you and your health needs are our priority.  As part of our continuing mission to provide you with exceptional heart care, we have created designated Provider Care Teams.  These Care Teams include your primary Cardiologist  (physician) and Advanced Practice Providers (APPs -  Physician Assistants and Nurse Practitioners) who all work together to provide you with the care you need, when you need it.  We recommend signing up for the patient portal called "MyChart".  Sign up information is provided on this After Visit Summary.  MyChart is used to connect with patients for Virtual Visits (Telemedicine).  Patients are able to view lab/test results, encounter notes, upcoming appointments, etc.  Non-urgent messages can be sent to your provider as well.   To learn more about what you can do with MyChart, go to [email or web address].    Your next appointment:   1 year(s)  The format for your next appointment:   In Person  Provider:   Thomasene RippleKardie Tianne Plott, DO   Other Instructions      Adopting a Healthy Lifestyle.  Know what a healthy weight is for you (roughly BMI <25) and aim to maintain this   Aim for 7+ servings of fruits and vegetables daily   65-80+ fluid ounces of water or unsweet tea for healthy kidneys   Limit to max 1 drink of alcohol per day; avoid smoking/tobacco   Limit animal fats in diet for cholesterol and heart health - choose grass fed whenever available   Avoid highly processed foods, and foods high in saturated/trans fats   Aim for low stress - take time to unwind and care for your mental health   Aim for 150 min of moderate intensity exercise weekly for heart health, and weights twice weekly for bone health   Aim for 7-9 hours of sleep daily   When it comes to diets, agreement about the perfect plan isnt easy to find, even among the experts. Experts at the [employer] developed an idea known as the Healthy Eating Plate. Just imagine a plate divided into logical, healthy portions.   The emphasis is on diet quality:   Load up on vegetables and fruits - one-half of your plate: Aim for color and variety, and remember that potatoes dont count.   Go for whole grains -  one-quarter of your plate: Whole wheat, barley, wheat berries, quinoa, oats, brown rice, and foods made with them. If you want pasta, go with whole wheat pasta.   Protein power - one-quarter of your plate: Fish, chicken, beans, and nuts are all healthy, versatile protein sources. Limit red meat.   The diet, however, does go beyond the plate, offering a few other suggestions.   Use healthy plant oils, such as olive, canola, soy, corn, sunflower and peanut. Check the labels, and avoid partially hydrogenated oil, which have unhealthy trans fats.  If youre thirsty, drink water. Coffee and tea are good in moderation, but skip sugary drinks and limit milk and dairy products to one or two daily servings.   The type of carbohydrate in the diet is more important than the amount. Some sources of carbohydrates, such as vegetables, fruits, whole grains, and beans-are healthier than others.   Finally, stay active  Signed, Thomasene Ripple, DO  12/14/2020 8:14 AM    [facility]

## 2021-09-28 IMAGING — CT CT HEART MORPH/PULM VEIN W/ CM & W/O CA SCORE
1 series · 4 of 6 positions shown, 5 images · non-contrast
Comparison: None.
COMPARISON: None.

Addendum:
EXAM:
OVER-READ INTERPRETATION  CT CHEST

The following report is an over-read performed by radiologist Dr.
Tripti Tiger [REDACTED] on 07/22/2020. This
over-read does not include interpretation of cardiac or coronary
anatomy or pathology. The coronary calcium score/coronary CTA
interpretation by the cardiologist is attached.
CLINICAL DATA: Atrial fibrillation scheduled for Watchman procedure
Cardiac CT/CTA
TECHNIQUE: The patient was scanned on a Siemens Somatom scanner.

[Series 468: watchman · 0.31mm/px · 4 of 6 slices shown, 5 images]
[im 2/6  vessel]
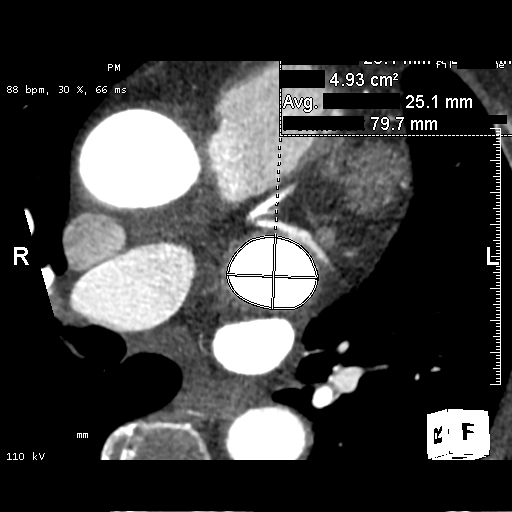
[im 2/6  lung]
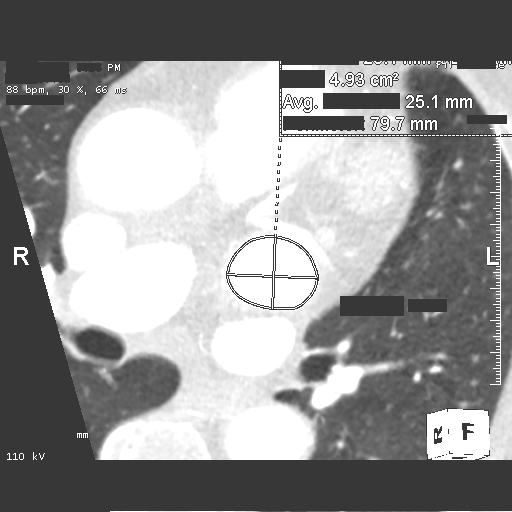
[im 3/6  vessel]
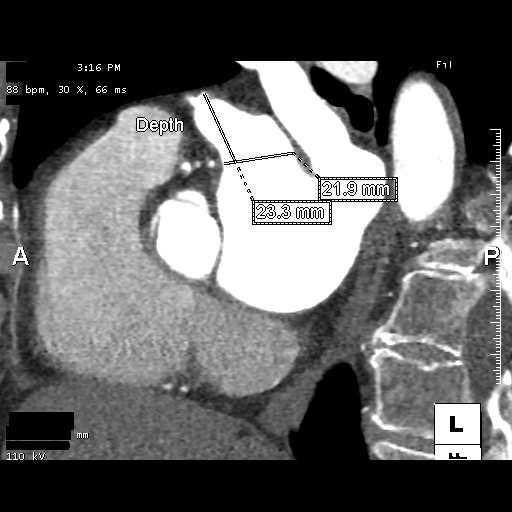
[im 4/6  vessel]
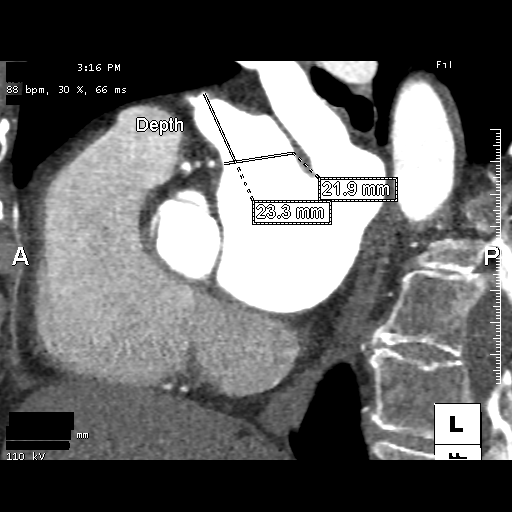
[im 5/6  vessel]
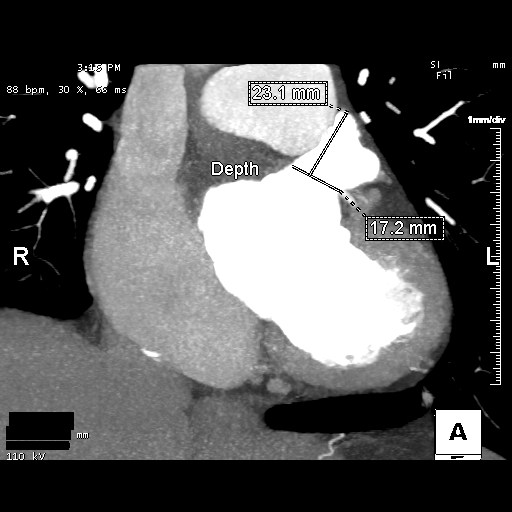

[4 of 6 positions shown; findings below may reference images not displayed]

FINDINGS: Aortic atherosclerosis. Within the visualized portions of the thorax
there are no suspicious appearing pulmonary nodules or masses, there
is no acute consolidative airspace disease, no pleural effusions, no
pneumothorax and no lymphadenopathy. Visualized portions of the
upper abdomen again demonstrates a low-attenuation lesion in the
left lobe of the liver anteriorly which is incompletely imaged but
measures approximately 2.8 cm in diameter, similar to prior studies,
compatible with a hepatic cyst. There are no aggressive appearing
lytic or blastic lesions noted in the visualized portions of the
skeleton.
IMPRESSION: 1.  Aortic Atherosclerosis (FP8D0-END.D).
FINDINGS: A 120 kV prospective scan was triggered in the descending thoracic
aorta at 111 HU's. Gantry rotation speed was 280 msecs and
collimation was .9 mm. No beta blockade and no NTG was given. The 3D
data set was reconstructed in 5% intervals of the R-R cycle. Phases
were analyzed on a dedicated work station using MPR, MIP and VRT
modes. The patient received 80 cc of contrast.

Left atrial appendage: MAXIMO is windsock type. Landing zone measures
28.1 mm x 22.4 mm in diameter with area 4.93 cm^2 and perimeter
79.7 mm. Maximum length measures 23.3mm

Left atrium: Normal in size. No thrombus

Left ventricle: Normal in size.

Right atrium: Mild enlargement

Right ventricle:  Normal in size.

Pericardium: Normal thickness

Pulmonary veins: Normal configuration

Pulmonary arteries: Normal size

Coronary Arteries: Normal coronary origin. Right dominance. The
study was performed without use of NTG and insufficient for plaque
evaluation. Calcium score 168 (65th percentile)
IMPRESSION: 1. There is no thrombus in the left atrial appendage.

2. Left atrial appendage landing zone measures 28 mm x 22 mm in
diameter with area 4.93 cm^2 and perimeter 80 mm. Maximum length
measures 23mm. Measurements are appropriate for a 35mm Elif Vi
device (maximum diameter 28mm, 20% compression)

3. An inferior and mid HORTENSIA puncture site is recommended.

4. Coronary calcium score 168 (65th percentile)

*** End of Addendum ***
EXAM:
OVER-READ INTERPRETATION  CT CHEST

The following report is an over-read performed by radiologist Dr.
Tripti Tiger [REDACTED] on 07/22/2020. This
over-read does not include interpretation of cardiac or coronary
anatomy or pathology. The coronary calcium score/coronary CTA
interpretation by the cardiologist is attached.
FINDINGS: Aortic atherosclerosis. Within the visualized portions of the thorax
there are no suspicious appearing pulmonary nodules or masses, there
is no acute consolidative airspace disease, no pleural effusions, no
pneumothorax and no lymphadenopathy. Visualized portions of the
upper abdomen again demonstrates a low-attenuation lesion in the
left lobe of the liver anteriorly which is incompletely imaged but
measures approximately 2.8 cm in diameter, similar to prior studies,
compatible with a hepatic cyst. There are no aggressive appearing
lytic or blastic lesions noted in the visualized portions of the
skeleton.
IMPRESSION: 1.  Aortic Atherosclerosis (FP8D0-END.D).
